# Patient Record
Sex: Female | Born: 1990 | Race: White | Hispanic: No | Marital: Married | Smoking: Never smoker
Health system: Southern US, Community
[De-identification: ages and names within clinical notes are randomized; demographics above are authoritative.]

## PROBLEM LIST (undated history)

## (undated) DIAGNOSIS — D649 Anemia, unspecified: Secondary | ICD-10-CM

## (undated) HISTORY — PX: TUBAL LIGATION: SHX77

---

## 2011-04-08 ENCOUNTER — Encounter (HOSPITAL_COMMUNITY): Payer: Self-pay | Admitting: *Deleted

## 2011-04-08 ENCOUNTER — Emergency Department (HOSPITAL_COMMUNITY)
Admission: EM | Admit: 2011-04-08 | Discharge: 2011-04-08 | Disposition: A | Discharge status: Home / Self Care | Payer: Medicaid Other | Attending: Emergency Medicine | Admitting: Emergency Medicine

## 2011-04-08 DIAGNOSIS — O9989 Other specified diseases and conditions complicating pregnancy, childbirth and the puerperium: Secondary | ICD-10-CM | POA: Insufficient documentation

## 2011-04-08 DIAGNOSIS — G40909 Epilepsy, unspecified, not intractable, without status epilepticus: Secondary | ICD-10-CM | POA: Insufficient documentation

## 2011-04-08 DIAGNOSIS — T2112XA Burn of first degree of abdominal wall, initial encounter: Secondary | ICD-10-CM | POA: Insufficient documentation

## 2011-04-08 DIAGNOSIS — R109 Unspecified abdominal pain: Secondary | ICD-10-CM | POA: Insufficient documentation

## 2011-04-08 DIAGNOSIS — T300 Burn of unspecified body region, unspecified degree: Secondary | ICD-10-CM
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
## 2011-04-09 NOTE — ED Provider Notes (Signed)
Medical screening examination/treatment/procedure(s) were performed by non-physician practitioner and as supervising physician I was immediately available for consultation/collaboration.    Nelia Shi, MD 04/09/11 2015

## 2011-04-15 ENCOUNTER — Emergency Department (HOSPITAL_COMMUNITY): Payer: Medicaid Other

## 2011-04-15 ENCOUNTER — Encounter (HOSPITAL_COMMUNITY): Payer: Self-pay | Admitting: *Deleted

## 2011-04-15 ENCOUNTER — Other Ambulatory Visit: Payer: Self-pay

## 2011-04-15 ENCOUNTER — Emergency Department (HOSPITAL_COMMUNITY)
Admission: EM | Admit: 2011-04-15 | Discharge: 2011-04-15 | Disposition: A | Discharge status: Home / Self Care | Payer: Medicaid Other | Attending: Emergency Medicine | Admitting: Emergency Medicine

## 2011-04-15 DIAGNOSIS — O99019 Anemia complicating pregnancy, unspecified trimester: Secondary | ICD-10-CM | POA: Insufficient documentation

## 2011-04-15 DIAGNOSIS — M542 Cervicalgia: Secondary | ICD-10-CM | POA: Insufficient documentation

## 2011-04-15 DIAGNOSIS — O99891 Other specified diseases and conditions complicating pregnancy: Secondary | ICD-10-CM | POA: Insufficient documentation

## 2011-04-15 DIAGNOSIS — H539 Unspecified visual disturbance: Secondary | ICD-10-CM | POA: Insufficient documentation

## 2011-04-15 DIAGNOSIS — F40298 Other specified phobia: Secondary | ICD-10-CM | POA: Insufficient documentation

## 2011-04-15 DIAGNOSIS — D649 Anemia, unspecified: Secondary | ICD-10-CM | POA: Insufficient documentation

## 2011-04-15 DIAGNOSIS — R209 Unspecified disturbances of skin sensation: Secondary | ICD-10-CM | POA: Insufficient documentation

## 2011-04-15 DIAGNOSIS — M79609 Pain in unspecified limb: Secondary | ICD-10-CM | POA: Insufficient documentation

## 2011-04-15 DIAGNOSIS — R0602 Shortness of breath: Secondary | ICD-10-CM | POA: Insufficient documentation

## 2011-04-15 DIAGNOSIS — R079 Chest pain, unspecified: Secondary | ICD-10-CM | POA: Insufficient documentation

## 2011-04-15 DIAGNOSIS — R5381 Other malaise: Secondary | ICD-10-CM | POA: Insufficient documentation

## 2011-04-15 HISTORY — DX: Anemia, unspecified: D64.9

## 2011-04-15 LAB — DIFFERENTIAL
Eosinophils Absolute: 0.1 10*3/uL (ref 0.0–0.7)
Eosinophils Relative: 1 % (ref 0–5)
Lymphs Abs: 1.2 10*3/uL (ref 0.7–4.0)
Monocytes Absolute: 0.4 10*3/uL (ref 0.1–1.0)
Monocytes Relative: 5 % (ref 3–12)

## 2011-04-15 LAB — BASIC METABOLIC PANEL
BUN: 6 mg/dL (ref 6–23)
Calcium: 9.5 mg/dL (ref 8.4–10.5)
Creatinine, Ser: 0.43 mg/dL — ABNORMAL LOW (ref 0.50–1.10)
GFR calc non Af Amer: >90 mL/min (ref >90)
Glucose, Bld: 76 mg/dL (ref 70–99)

## 2011-04-15 LAB — CBC
Hemoglobin: 11.3 g/dL — ABNORMAL LOW (ref 12.0–15.0)
MCH: 30.2 pg (ref 26.0–34.0)
MCV: 90.4 fL (ref 78.0–100.0)
Platelets: 262 10*3/uL (ref 150–400)
RBC: 3.74 MIL/uL — ABNORMAL LOW (ref 3.87–5.11)

## 2011-04-15 MED ORDER — LORAZEPAM 1 MG PO TABS
1.0000 mg | ORAL_TABLET | Freq: Once | ORAL | Status: AC
  Administered 2011-04-15: 1 mg via ORAL
  Filled 2011-04-15: qty 1

## 2011-04-15 MED ORDER — ACETAMINOPHEN 325 MG PO TABS
650.0000 mg | ORAL_TABLET | Freq: Once | ORAL | Status: AC
  Administered 2011-04-15: 650 mg via ORAL
  Filled 2011-04-15: qty 2
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
## 2011-04-15 NOTE — ED Notes (Signed)
Neurology consult done as requested by Dr. Bebe Shaggy.

## 2011-04-15 NOTE — ED Notes (Signed)
Pt requesting something to drink, pt offered fluids per EDP approval.

## 2011-04-15 NOTE — ED Notes (Signed)
Neuro consult being done via video

## 2011-04-15 NOTE — ED Notes (Signed)
Pt off the floor for MRI

## 2011-04-15 NOTE — ED Notes (Addendum)
Pt returned from MRI °

## 2011-04-15 NOTE — ED Notes (Signed)
Pt was standing in line at [facility] and started experiencing sudden severe neck pain radiating down bil arms.  Pt states vision went blurry, numbness down bil arms and became dizzy.  Reports episode lasted approx 5 min.  Denies syncope.  Denies blurry vision and arm numbness at this time.  C/o pain to neck and down both arms.  Denies neck injury.  Pt approx [redacted] wks pregnant.

## 2014-01-02 ENCOUNTER — Encounter (HOSPITAL_COMMUNITY): Payer: Self-pay | Admitting: *Deleted

## 2016-08-21 ENCOUNTER — Ambulatory Visit: Payer: Self-pay | Admitting: Family

## 2016-08-22 ENCOUNTER — Encounter: Payer: Self-pay | Admitting: Family

## 2017-05-07 ENCOUNTER — Emergency Department (HOSPITAL_COMMUNITY)
Admission: EM | Admit: 2017-05-07 | Discharge: 2017-05-07 | Disposition: A | Discharge status: Home / Self Care | Payer: Medicaid Other | Attending: Emergency Medicine | Admitting: Emergency Medicine

## 2017-05-07 ENCOUNTER — Encounter (HOSPITAL_COMMUNITY): Payer: Self-pay

## 2017-05-07 ENCOUNTER — Emergency Department (HOSPITAL_COMMUNITY): Payer: Medicaid Other

## 2017-05-07 DIAGNOSIS — R109 Unspecified abdominal pain: Secondary | ICD-10-CM | POA: Diagnosis present

## 2017-05-07 DIAGNOSIS — N200 Calculus of kidney: Secondary | ICD-10-CM | POA: Diagnosis not present

## 2017-05-07 DIAGNOSIS — Z79899 Other long term (current) drug therapy: Secondary | ICD-10-CM | POA: Insufficient documentation

## 2017-05-07 LAB — CBC WITH DIFFERENTIAL/PLATELET
BASOS ABS: 0 10*3/uL (ref 0.0–0.1)
BASOS PCT: 0 %
EOS ABS: 0 10*3/uL (ref 0.0–0.7)
EOS PCT: 1 %
HCT: 35.5 % — ABNORMAL LOW (ref 36.0–46.0)
Hemoglobin: 11 g/dL — ABNORMAL LOW (ref 12.0–15.0)
LYMPHS PCT: 13 %
Lymphs Abs: 0.8 10*3/uL (ref 0.7–4.0)
MCH: 29.9 pg (ref 26.0–34.0)
MCHC: 31 g/dL (ref 30.0–36.0)
MCV: 96.5 fL (ref 78.0–100.0)
Monocytes Absolute: 0.4 10*3/uL (ref 0.1–1.0)
Monocytes Relative: 8 %
Neutro Abs: 4.6 10*3/uL (ref 1.7–7.7)
Neutrophils Relative %: 78 %
PLATELETS: 223 10*3/uL (ref 150–400)
RBC: 3.68 MIL/uL — ABNORMAL LOW (ref 3.87–5.11)
RDW: 14.2 % (ref 11.5–15.5)
WBC: 5.9 10*3/uL (ref 4.0–10.5)

## 2017-05-07 LAB — URINALYSIS, ROUTINE W REFLEX MICROSCOPIC
BACTERIA UA: NONE SEEN
BILIRUBIN URINE: NEGATIVE
Glucose, UA: NEGATIVE mg/dL
Ketones, ur: 5 mg/dL — AB
Leukocytes, UA: NEGATIVE
NITRITE: NEGATIVE
PROTEIN: NEGATIVE mg/dL
SPECIFIC GRAVITY, URINE: 1.008 (ref 1.005–1.030)
pH: 6 (ref 5.0–8.0)

## 2017-05-07 LAB — COMPREHENSIVE METABOLIC PANEL
ALBUMIN: 4.1 g/dL (ref 3.5–5.0)
ALT: 12 U/L — AB (ref 14–54)
AST: 19 U/L (ref 15–41)
Alkaline Phosphatase: 50 U/L (ref 38–126)
Anion gap: 11 (ref 5–15)
BUN: 8 mg/dL (ref 6–20)
CHLORIDE: 106 mmol/L (ref 101–111)
CO2: 22 mmol/L (ref 22–32)
CREATININE: 0.48 mg/dL (ref 0.44–1.00)
Calcium: 8.8 mg/dL — ABNORMAL LOW (ref 8.9–10.3)
GFR calc Af Amer: >60 mL/min (ref >60)
Glucose, Bld: 82 mg/dL (ref 65–99)
POTASSIUM: 3.3 mmol/L — AB (ref 3.5–5.1)
SODIUM: 139 mmol/L (ref 135–145)
Total Bilirubin: 0.7 mg/dL (ref 0.3–1.2)
Total Protein: 7.6 g/dL (ref 6.5–8.1)

## 2017-05-07 LAB — LIPASE, BLOOD: LIPASE: 32 U/L (ref 11–51)

## 2017-05-07 LAB — PREGNANCY, URINE: PREG TEST UR: NEGATIVE

## 2017-05-07 MED ORDER — ONDANSETRON 4 MG PO TBDP: 4.0000 mg | ORAL_TABLET | Freq: Three times a day (TID) | ORAL | 0 refills | Status: DC | PRN

## 2017-05-07 MED ORDER — SODIUM CHLORIDE 0.9 % IV BOLUS (SEPSIS)
1000.0000 mL | Freq: Once | INTRAVENOUS | Status: AC
  Administered 2017-05-07: 1000 mL via INTRAVENOUS

## 2017-05-07 MED ORDER — ONDANSETRON HCL 4 MG/2ML IJ SOLN
4.0000 mg | Freq: Once | INTRAMUSCULAR | Status: AC
  Administered 2017-05-07: 4 mg via INTRAVENOUS
  Filled 2017-05-07: qty 2

## 2017-05-07 MED ORDER — OXYCODONE-ACETAMINOPHEN 5-325 MG PO TABS: 1.0000 | ORAL_TABLET | ORAL | 0 refills | Status: DC | PRN

## 2017-05-07 MED ORDER — MORPHINE SULFATE (PF) 4 MG/ML IV SOLN
4.0000 mg | Freq: Once | INTRAVENOUS | Status: AC
  Administered 2017-05-07: 4 mg via INTRAVENOUS
  Filled 2017-05-07: qty 1

## 2017-05-07 MED ORDER — KETOROLAC TROMETHAMINE 30 MG/ML IJ SOLN
30.0000 mg | Freq: Once | INTRAMUSCULAR | Status: AC
  Administered 2017-05-07: 30 mg via INTRAVENOUS
  Filled 2017-05-07: qty 1

## 2017-05-07 MED ORDER — IOPAMIDOL (ISOVUE-300) INJECTION 61%
100.0000 mL | Freq: Once | INTRAVENOUS | Status: AC | PRN
  Administered 2017-05-07: 100 mL via INTRAVENOUS

## 2017-05-07 NOTE — ED Provider Notes (Signed)
[facility] EMERGENCY DEPARTMENT Provider Note   CSN: [id number] Arrival date & time: 05/07/17  3154     History   Chief Complaint Chief Complaint  Patient presents with  . Back Pain    HPI Vanessa Villanueva is a 27 y.o. female.  Right flank pain with radiation to the right lower quadrant since 4 AM today with associated nausea and diarrhea.  No dysuria, hematuria, vaginal bleeding, vaginal discharge.  No history of kidney stones.  Severity of pain is moderate to severe.  Nothing makes symptoms better or worse.      Past Medical History:  Diagnosis Date  . Anemia     There are no active problems to display for this patient.   History reviewed. No pertinent surgical history.  OB History    Gravida Para Term Preterm AB Living   1             SAB TAB Ectopic Multiple Live Births                   Home Medications    Prior to Admission medications   Medication Sig Start Date End Date Taking? Authorizing Provider  ferrous sulfate 325 (65 FE) MG tablet Take 325 mg by mouth 2 (two) times daily.   Yes [provider]  FLUoxetine (PROZAC) 20 MG capsule Take 20 mg by mouth daily.    [provider]  ondansetron (ZOFRAN ODT) 4 MG disintegrating tablet Take 1 tablet (4 mg total) by mouth every 8 (eight) hours as needed for nausea or vomiting. 05/07/17   Nat Christen, MD  oxyCODONE-acetaminophen (PERCOCET) 5-325 MG tablet Take 1-2 tablets by mouth every 4 (four) hours as needed. 05/07/17   Nat Christen, MD    Family History No family history on file.  Social History Social History   Tobacco Use  . Smoking status: Never Smoker  . Smokeless tobacco: Never Used  Substance Use Topics  . Alcohol use: No  . Drug use: No     Allergies   Codeine   Review of Systems Review of Systems  All other systems reviewed and are negative.    Physical Exam Updated Vital Signs BP 101/79 (BP Location: Right Arm)   Pulse 71   Temp 99 F (37.2 C) (Oral)    Resp 18   Ht 5\' 1"  (1.549 m)   Wt 52.2 kg (115 lb)   LMP 05/02/2017 (Approximate)   SpO2 100%   BMI 21.73 kg/m   Physical Exam  Constitutional: She is oriented to person, place, and time. She appears well-developed and well-nourished.  HENT:  Head: Normocephalic and atraumatic.  Eyes: Conjunctivae are normal.  Neck: Neck supple.  Cardiovascular: Normal rate and regular rhythm.  Pulmonary/Chest: Effort normal and breath sounds normal.  Abdominal:  Minimal tenderness right lower quadrant  Genitourinary:  Genitourinary Comments: Right CVAT  Musculoskeletal: Normal range of motion.  Neurological: She is alert and oriented to person, place, and time.  Skin: Skin is warm and dry.  Psychiatric: She has a normal mood and affect. Her behavior is normal.  Nursing note and vitals reviewed.    ED Treatments / Results  Labs (all labs ordered are listed, but only abnormal results are displayed) Labs Reviewed  URINALYSIS, ROUTINE W REFLEX MICROSCOPIC - Abnormal; Notable for the following components:      Result Value   Color, Urine STRAW (*)    APPearance HAZY (*)    Hgb urine dipstick LARGE (*)  Ketones, ur 5 (*)    Squamous Epithelial / LPF 6-30 (*)    All other components within normal limits  CBC WITH DIFFERENTIAL/PLATELET - Abnormal; Notable for the following components:   RBC 3.68 (*)    Hemoglobin 11.0 (*)    HCT 35.5 (*)    All other components within normal limits  COMPREHENSIVE METABOLIC PANEL - Abnormal; Notable for the following components:   Potassium 3.3 (*)    Calcium 8.8 (*)    ALT 12 (*)    All other components within normal limits  PREGNANCY, URINE  LIPASE, BLOOD    EKG  EKG Interpretation None       Radiology Ct Abdomen Pelvis W Contrast  Result Date: 05/07/2017 CLINICAL DATA:  27 year old female with right flank pain radiating to the suprapubic region. Nausea and diarrhea since 0400 hours today. EXAM: CT ABDOMEN AND PELVIS WITH CONTRAST TECHNIQUE:  Multidetector CT imaging of the abdomen and pelvis was performed using the standard protocol following bolus administration of intravenous contrast. CONTRAST:  146mL ISOVUE-300 IOPAMIDOL (ISOVUE-300) INJECTION 61% COMPARISON:  Prior OB ultrasound 07/19/2015. FINDINGS: Lower chest: Mild crowding of lung markings at the lung bases. No pericardial or pleural effusion. Hepatobiliary: Negative liver, gallbladder, and decompressed biliary ducts. Pancreas: Negative. Spleen: Negative. Adrenals/Urinary Tract: Normal adrenal glands. Punctate left nephrolithiasis. No left hydronephrosis or perinephric stranding. Proximal left ureter appears normal. There is a mildly delayed nephrogram on the right. There are small 2-3 millimeter right upper pole renal calculi. The right renal collecting system is mildly enlarged compatible with hydronephrosis. The proximal and mid right ureter also appear dilated. The course of the distal right ureter becomes obscured in the pelvis around the right adnexa. It appears the ureter is decompressed at the uretero vesicle junction. A 4 x 5 millimeter calculus along the right pelvic sidewall could be within the distal right ureter and is in proximity to the distal visible ureter as seen on coronal images 35-37. A larger more anterior calculus on series 2, image 65 appears associated with the uterus and right adnexa. There are additional small pelvic floor phleboliths. Unremarkable urinary bladder. Stomach/Bowel: Decompressed rectum. Questionable rectal mucosal hyperenhancement but no perirectal inflammatory stranding. Mildly redundant sigmoid colon is also decompressed with no definite mucosal enhancement. Likewise, decompressed left colon. Gas-filled but nondilated transverse colon. Oral contrast has reached the ascending colon. No right colon wall thickening or inflammation. Negative terminal ileum. The appendix is not well identified and must be diminutive. There is no pericecal inflammation. No  mesenteric inflammation in the distal small bowel. Small bowel loops are fills with air and oral contrast but no abnormally dilated loops are identified. Negative stomach and duodenum. No abdominal free air or free fluid. Vascular/Lymphatic: Major arterial structures in the abdomen and pelvis are normal. Portal venous system is patent. No lymphadenopathy. Reproductive: Prominent parametrial enhancement. Enhancing probable follicle/corpus luteum in the right ovary on series 2, image 63. Other: Small volume pelvic free fluid, mostly in the cul-de-sac. Musculoskeletal: Mild lower lumbar facet hypertrophy. No other osseous abnormality. IMPRESSION: 1. Acute obstructive uropathy suspected on the right due to a 4 x 5 mm distal right ureteral calculus located along the right pelvic sidewall. There is mild to moderate right hydronephrosis, proximal right hydroureter, and a mildly delayed right nephrogram. 2. Underlying bilateral nephrolithiasis. 3. The appendix is poorly delineated and must be decompressed. No evidence of acute appendicitis. 4. Trace pelvic free fluid and parametrial hyperenhancement are likely physiologic. Electronically Signed   By: Lemmie Evens  [place] Crane M.D.   On: 05/07/2017 10:49    Procedures Procedures (including critical care time)  Medications Ordered in ED Medications  ondansetron (ZOFRAN) injection 4 mg (4 mg Intravenous Given 05/07/17 0755)  sodium chloride 0.9 % bolus 1,000 mL (0 mLs Intravenous Stopped 05/07/17 0830)  morphine 4 MG/ML injection 4 mg (4 mg Intravenous Given 05/07/17 0750)  iopamidol (ISOVUE-300) 61 % injection 100 mL (100 mLs Intravenous Contrast Given 05/07/17 1013)  ketorolac (TORADOL) 30 MG/ML injection 30 mg (30 mg Intravenous Given 05/07/17 1133)  ondansetron (ZOFRAN) injection 4 mg (4 mg Intravenous Given 05/07/17 1134)     Initial Impression / Assessment and Plan / ED Course  I have reviewed the triage vital signs and the nursing notes.  Pertinent labs & imaging results that  were available during my care of the patient were reviewed by me and considered in my medical decision making (see chart for details).     Patient presents with right flank pain radiating to the right lower quadrant/suprapubic area.  CT scan reveals a 4 x 5 mm distal ureteral stone with hydronephrosis.  Pain was treated adequately in the emergency department c morphine and Toradol.  Discharge medication Percocet and Zofran 4 mg ODT.  Referral to urology.  Discussed with patient and her husband.  Final Clinical Impressions(s) / ED Diagnoses   Final diagnoses:  Right kidney stone    ED Discharge Orders        Ordered    oxyCODONE-acetaminophen (PERCOCET) 5-325 MG tablet  Every 4 hours PRN     05/07/17 1149    ondansetron (ZOFRAN ODT) 4 MG disintegrating tablet  Every 8 hours PRN     05/07/17 1149       Nat Christen, MD 05/07/17 1301

## 2017-05-07 NOTE — Discharge Instructions (Signed)
You have a kidney stone on the right side.  Medicine for pain, nausea.  Follow-up with urologist.  Phone number given.

## 2017-05-07 NOTE — ED Triage Notes (Signed)
Pt states she awoke at approx 4 am with right middle back pain, nausea and diarrhea x 2.  Pt denies injury or trauma.

## 2017-05-15 ENCOUNTER — Ambulatory Visit (INDEPENDENT_AMBULATORY_CARE_PROVIDER_SITE_OTHER): Payer: Medicaid Other | Admitting: Urology

## 2017-05-15 ENCOUNTER — Ambulatory Visit (HOSPITAL_COMMUNITY)
Admission: RE | Admit: 2017-05-15 | Discharge: 2017-05-15 | Disposition: A | Discharge status: Home / Self Care | Payer: Medicaid Other | Source: Ambulatory Visit | Attending: Urology | Admitting: Urology

## 2017-05-15 ENCOUNTER — Other Ambulatory Visit: Payer: Self-pay | Admitting: Urology

## 2017-05-15 DIAGNOSIS — R935 Abnormal findings on diagnostic imaging of other abdominal regions, including retroperitoneum: Secondary | ICD-10-CM | POA: Insufficient documentation

## 2017-05-15 DIAGNOSIS — N201 Calculus of ureter: Secondary | ICD-10-CM | POA: Diagnosis not present

## 2017-05-21 ENCOUNTER — Ambulatory Visit: Payer: Self-pay | Admitting: Family

## 2017-05-29 ENCOUNTER — Ambulatory Visit (INDEPENDENT_AMBULATORY_CARE_PROVIDER_SITE_OTHER): Payer: Medicaid Other | Admitting: Urology

## 2017-05-29 DIAGNOSIS — R1084 Generalized abdominal pain: Secondary | ICD-10-CM | POA: Diagnosis not present

## 2017-05-29 DIAGNOSIS — N201 Calculus of ureter: Secondary | ICD-10-CM

## 2017-06-02 ENCOUNTER — Ambulatory Visit: Payer: Medicaid Other | Admitting: Family

## 2017-06-02 ENCOUNTER — Ambulatory Visit (INDEPENDENT_AMBULATORY_CARE_PROVIDER_SITE_OTHER): Payer: Medicaid Other

## 2017-06-02 ENCOUNTER — Encounter: Payer: Self-pay | Admitting: Family

## 2017-06-02 VITALS — BP 110/78 | HR 81 | Temp 97.6°F | Ht 61.5 in | Wt 115.6 lb

## 2017-06-02 DIAGNOSIS — W108XXA Fall (on) (from) other stairs and steps, initial encounter: Secondary | ICD-10-CM | POA: Diagnosis not present

## 2017-06-02 DIAGNOSIS — M79602 Pain in left arm: Secondary | ICD-10-CM

## 2017-06-02 DIAGNOSIS — T148XXA Other injury of unspecified body region, initial encounter: Secondary | ICD-10-CM

## 2017-06-02 DIAGNOSIS — S59912A Unspecified injury of left forearm, initial encounter: Secondary | ICD-10-CM

## 2017-06-02 NOTE — Patient Instructions (Signed)

## 2017-06-02 NOTE — Progress Notes (Signed)
Subjective:    Patient ID: Vanessa Villanueva, female    DOB: Jul 10, 1990, 27 y.o.   MRN: [id number]  .  HPI PT presents to the office today to establish and complaints of a "knot" on left arm. PT states she fell down the steps about 4 weeks ago and and had bruising and swelling.   She reports that the bruising and the swelling improve slightly, but still has a knot.   She reports constant pain of 6 out 10 that is worse when she puts pressure on it   Review of Systems  All other systems reviewed and are negative.   Breast Cancer-relatedfamily history is not on file.  Social History   Socioeconomic History  . Marital status: Married    Spouse name: Not on file  . Number of children: Not on file  . Years of education: Not on file  . Highest education level: Not on file  Occupational History  . Not on file  Social Needs  . Financial resource strain: Not on file  . Food insecurity:    Worry: Not on file    Inability: Not on file  . Transportation needs:    Medical: Not on file    Non-medical: Not on file  Tobacco Use  . Smoking status: Never Smoker  . Smokeless tobacco: Never Used  Substance and Sexual Activity  . Alcohol use: No  . Drug use: No  . Sexual activity: Never  Lifestyle  . Physical activity:    Days per week: Not on file    Minutes per session: Not on file  . Stress: Not on file  Relationships  . Social connections:    Talks on phone: Not on file    Gets together: Not on file    Attends religious service: Not on file    Active member of club or organization: Not on file    Attends meetings of clubs or organizations: Not on file    Relationship status: Not on file  Other Topics Concern  . Not on file  Social History Narrative  . Not on file       Objective:   Physical Exam  Constitutional: She is oriented to person, place, and time. She appears well-developed and well-nourished. No distress.  HENT:  Head: Normocephalic.  Eyes: Pupils are  equal, round, and reactive to light.  Neck: Normal range of motion. Neck supple. No thyromegaly present.  Cardiovascular: Normal rate, regular rhythm, normal heart sounds and intact distal pulses.  No murmur heard. Pulmonary/Chest: Effort normal and breath sounds normal. No respiratory distress. She has no wheezes.  Abdominal: Soft. Bowel sounds are normal. She exhibits no distension. There is no tenderness.  Musculoskeletal: Normal range of motion. She exhibits no edema or tenderness.  Neurological: She is alert and oriented to person, place, and time. Cranial nerve deficit: knot on left forearm that is tender, no bruising   Skin: Skin is warm and dry.  Psychiatric: She has a normal mood and affect. Her behavior is normal. Judgment and thought content normal.  Vitals reviewed.    BP 110/78   Pulse 81   Temp 97.6 F (36.4 C) (Oral)   Ht 5' 1.5" (1.562 m)   Wt 115 lb 9.6 oz (52.4 kg)   LMP 05/02/2017 (Approximate)   BMI 21.49 kg/m      Assessment & Plan:  1. Hematoma Ice NSAID's as needed - DG Forearm Left; Future  2. Fall down steps, initial encounter -  DG Forearm Left; Future  3. Pain of left upper extremity Will do x-ray to rule out any injury, I believe this is a hematoma that will heal in the next few weeks.  RTO if it does not improve or worsen  - DG Forearm Left; Future    Evelina Dun, FNP

## 2017-08-28 ENCOUNTER — Ambulatory Visit: Payer: Medicaid Other | Admitting: Family

## 2017-08-28 ENCOUNTER — Encounter: Payer: Self-pay | Admitting: Family

## 2017-08-28 VITALS — BP 99/73 | HR 102 | Temp 98.8°F | Ht 61.5 in | Wt 117.2 lb

## 2017-08-28 DIAGNOSIS — R51 Headache: Secondary | ICD-10-CM | POA: Diagnosis not present

## 2017-08-28 DIAGNOSIS — R519 Headache, unspecified: Secondary | ICD-10-CM

## 2017-08-28 DIAGNOSIS — G44209 Tension-type headache, unspecified, not intractable: Secondary | ICD-10-CM

## 2017-08-28 MED ORDER — AMITRIPTYLINE HCL 10 MG PO TABS: ORAL_TABLET | ORAL | 1 refills | Status: DC
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
## 2017-08-28 NOTE — Patient Instructions (Addendum)

## 2017-09-01 ENCOUNTER — Telehealth: Payer: Self-pay | Admitting: *Deleted

## 2017-09-01 DIAGNOSIS — R519 Headache, unspecified: Secondary | ICD-10-CM

## 2017-09-01 DIAGNOSIS — R51 Headache: Principal | ICD-10-CM

## 2017-09-01 DIAGNOSIS — G44209 Tension-type headache, unspecified, not intractable: Secondary | ICD-10-CM

## 2017-09-01 MED ORDER — AMITRIPTYLINE HCL 10 MG PO TABS: ORAL_TABLET | ORAL | 1 refills | Status: DC

## 2017-09-01 NOTE — Telephone Encounter (Signed)
Fax received [facility] Script clarification To take for 14 days only or will this be continuous Written 1 tab qhs for 7 days, then 2 tabs for 7 days Please advise

## 2017-09-01 NOTE — Telephone Encounter (Signed)
This will be a continuous dose, but patient to titrate up on the dosage.

## 2017-09-01 NOTE — Addendum Note (Signed)
Addended by: Wardell Heath on: 09/01/2017 01:25 PM   Modules accepted: Orders

## 2017-09-29 ENCOUNTER — Ambulatory Visit: Payer: Medicaid Other | Admitting: Family

## 2018-07-05 ENCOUNTER — Other Ambulatory Visit: Payer: Self-pay

## 2018-07-05 ENCOUNTER — Ambulatory Visit (INDEPENDENT_AMBULATORY_CARE_PROVIDER_SITE_OTHER): Payer: Medicaid Other | Admitting: Nurse Practitioner

## 2018-07-05 ENCOUNTER — Encounter: Payer: Self-pay | Admitting: Nurse Practitioner

## 2018-07-05 DIAGNOSIS — K047 Periapical abscess without sinus: Secondary | ICD-10-CM

## 2018-07-05 MED ORDER — CLINDAMYCIN HCL 300 MG PO CAPS: 300.0000 mg | ORAL_CAPSULE | Freq: Four times a day (QID) | ORAL | 0 refills | Status: DC

## 2018-07-05 NOTE — Progress Notes (Signed)
   Virtual Visit via telephone Note  I connected with Vanessa Villanueva on 07/05/18 at 9:10 by telephone and verified that I am speaking with the correct person using two identifiers. Vanessa Villanueva is currently located at home and no one is currently with her during visit. The provider, Mary-Margaret Hassell Done, FNP is located in their office at time of visit.  I discussed the limitations, risks, security and privacy concerns of performing an evaluation and management service by telephone and the availability of in person appointments. I also discussed with the patient that there may be a patient responsible charge related to this service. The patient expressed understanding and agreed to proceed.   History and Present Illness:  Patient calls in today stating that she had a tooth break off 2 days ago on bottom left 1st molar. Started hurting immediately. Now her jaw is swollen and pain has increased. She has tried Sports coach and they all say that they cannot see her until office opens back up.   Review of Systems  Constitutional: Negative for chills and fever.  HENT: Negative.   Cardiovascular: Negative.   Genitourinary: Negative.   Musculoskeletal: Negative.   Skin: Negative.   Neurological: Negative.   Psychiatric/Behavioral: Negative.   All other systems reviewed and are negative.    Observations/Objective: Alert and oriented- answers all questions appropriately sounds in mild distress due to pain   Assessment and Plan: Vanessa Villanueva in today with chief complaint of Dental Pain   1. Tooth abscess Gargle with salt water motrin OTC for pain anbesol- can help relieve some pain Ice pack to face for swelling Meds ordered this encounter  Medications  . clindamycin (CLEOCIN) 300 MG capsule    Sig: Take 1 capsule (300 mg total) by mouth 4 (four) times daily.    Dispense:  40 capsule    Refill:  0    Order Specific Question:   Supervising Provider    Answer:    Caryl Pina A [[id number]]      Follow Up Instructions: Prn   I discussed the assessment and treatment plan with the patient. The patient was provided an opportunity to ask questions and all were answered. The patient agreed with the plan and demonstrated an understanding of the instructions.   The patient was advised to call back or seek an in-person evaluation if the symptoms worsen or if the condition fails to improve as anticipated.  The above assessment and management plan was discussed with the patient. The patient verbalized understanding of and has agreed to the management plan. Patient is aware to call the clinic if symptoms persist or worsen. Patient is aware when to return to the clinic for a follow-up visit. Patient educated on when it is appropriate to go to the emergency department.   Time call ended:  9:20  I provided 10 minutes of non-face-to-face time during this encounter.    Mary-Margaret Hassell Done, FNP

## 2018-09-24 ENCOUNTER — Other Ambulatory Visit: Payer: Medicaid Other

## 2018-09-24 ENCOUNTER — Other Ambulatory Visit: Payer: Self-pay

## 2018-09-24 ENCOUNTER — Encounter: Payer: Self-pay | Admitting: Family Medicine

## 2018-09-24 ENCOUNTER — Encounter (INDEPENDENT_AMBULATORY_CARE_PROVIDER_SITE_OTHER): Payer: Self-pay

## 2018-09-24 ENCOUNTER — Ambulatory Visit (INDEPENDENT_AMBULATORY_CARE_PROVIDER_SITE_OTHER): Payer: Medicaid Other | Admitting: Family Medicine

## 2018-09-24 DIAGNOSIS — J029 Acute pharyngitis, unspecified: Secondary | ICD-10-CM | POA: Diagnosis not present

## 2018-09-24 DIAGNOSIS — Z20822 Contact with and (suspected) exposure to covid-19: Secondary | ICD-10-CM

## 2018-09-24 DIAGNOSIS — R6889 Other general symptoms and signs: Secondary | ICD-10-CM | POA: Diagnosis not present

## 2018-09-24 MED ORDER — AMOXICILLIN 500 MG PO CAPS: 500.0000 mg | ORAL_CAPSULE | Freq: Two times a day (BID) | ORAL | 0 refills | Status: DC

## 2018-09-24 NOTE — Progress Notes (Signed)
Virtual Visit via telephone Note  I connected with Vanessa Villanueva on 09/24/18 at 1003 by telephone and verified that I am speaking with the correct person using two identifiers. Vanessa Villanueva is currently located at home and no other people are currently with her during visit. The provider, Fransisca Kaufmann Walter Grima, MD is located in their office at time of visit.  Call ended at 1017  I discussed the limitations, risks, security and privacy concerns of performing an evaluation and management service by telephone and the availability of in person appointments. I also discussed with the patient that there may be a patient responsible charge related to this service. The patient expressed understanding and agreed to proceed.   History and Present Illness: Patient is calling in with 3 days of feverish and nausea and vomiting 2 days ago.  Yesterday she woke up with a swollen lymph nodes and sore throat.  She has a really red throat and white patches. She is having pain with swallowing.  She has cough and hoarseness and dry cough. She is nasal congestion.  She denies any shortness of breath or wheezing.  She denies sick contacts that she knows of.   No diagnosis found.  Outpatient Encounter Medications as of 09/24/2018  Medication Sig  . amitriptyline (ELAVIL) 10 MG tablet Take 1 tablet (10 mg total) by mouth at bedtime for 7 days, THEN 2 tablets (20 mg total) at bedtime.  . clindamycin (CLEOCIN) 300 MG capsule Take 1 capsule (300 mg total) by mouth 4 (four) times daily.  . ferrous sulfate 325 (65 FE) MG tablet Take 325 mg by mouth 2 (two) times daily.   No facility-administered encounter medications on file as of 09/24/2018.     Review of Systems  Constitutional: Positive for fever. Negative for chills.  HENT: Positive for congestion, postnasal drip and sore throat. Negative for ear discharge, ear pain, rhinorrhea, sinus pressure and sneezing.   Eyes: Negative for pain, redness and visual  disturbance.  Respiratory: Positive for cough. Negative for chest tightness, shortness of breath and wheezing.   Cardiovascular: Negative for chest pain and leg swelling.  Genitourinary: Negative for difficulty urinating and dysuria.  Musculoskeletal: Negative for back pain and gait problem.  Skin: Negative for rash.  Neurological: Negative for light-headedness and headaches.  Psychiatric/Behavioral: Negative for agitation and behavioral problems.  All other systems reviewed and are negative.   Observations/Objective: Patient sounds comfortable and in no acute distress  Assessment and Plan: Problem List Items Addressed This Visit    None    Visit Diagnoses    Pharyngitis, unspecified etiology    -  Primary   Relevant Medications   amoxicillin (AMOXIL) 500 MG capsule   Other Relevant Orders   MYCHART COVID-19 HOME MONITORING PROGRAM   Novel Coronavirus, NAA ([facility])   Temperature monitoring       Follow Up Instructions:  Patient is recommend quarantine Recommended covid testing   I discussed the assessment and treatment plan with the patient. The patient was provided an opportunity to ask questions and all were answered. The patient agreed with the plan and demonstrated an understanding of the instructions.   The patient was advised to call back or seek an in-person evaluation if the symptoms worsen or if the condition fails to improve as anticipated.  The above assessment and management plan was discussed with the patient. The patient verbalized understanding of and has agreed to the management plan. Patient is aware to call the clinic if  symptoms persist or worsen. Patient is aware when to return to the clinic for a follow-up visit. Patient educated on when it is appropriate to go to the emergency department.    I provided 14 minutes of non-face-to-face time during this encounter.    Worthy Rancher, MD

## 2018-09-25 ENCOUNTER — Encounter (INDEPENDENT_AMBULATORY_CARE_PROVIDER_SITE_OTHER): Payer: Self-pay

## 2018-09-26 ENCOUNTER — Encounter (INDEPENDENT_AMBULATORY_CARE_PROVIDER_SITE_OTHER): Payer: Self-pay

## 2018-09-26 LAB — NOVEL CORONAVIRUS, NAA: SARS-CoV-2, NAA: NOT DETECTED

## 2018-09-27 ENCOUNTER — Encounter (INDEPENDENT_AMBULATORY_CARE_PROVIDER_SITE_OTHER): Payer: Self-pay

## 2018-09-30 ENCOUNTER — Encounter: Payer: Self-pay | Admitting: Family Medicine

## 2018-10-01 MED ORDER — CEFDINIR 300 MG PO CAPS: 300.0000 mg | ORAL_CAPSULE | Freq: Two times a day (BID) | ORAL | 0 refills | Status: DC

## 2018-10-02 ENCOUNTER — Telehealth: Payer: Self-pay

## 2018-10-02 ENCOUNTER — Encounter (INDEPENDENT_AMBULATORY_CARE_PROVIDER_SITE_OTHER): Payer: Self-pay

## 2018-10-02 NOTE — Telephone Encounter (Signed)
Pt was on amoxicillin due to strep throat. Talked to PCP yesterday and was prescribed. Sore throat is scratchy, mils. Using nasal spray and Vicks at night due to feeling stopped up, Robitussin daytime severe cold. SOB with activity due to heat. Denies SOB at rest. 99.8 temp. Cough is dry. Pt is using saline nasal spray. Advised pt to suck on cough drops or hard candy, salt water gargles 4-5 times per day, 2 teaspoons of honey at bedtime. Pt advised that if cough is becoming worse with the use of OTC cough medicine or if cough becomes productive with yellow or green phlegm to call PCP. Pt advised that is SOB worsens, or develops tightness or difficulty breathing, to call 911 immediately.

## 2018-11-16 ENCOUNTER — Encounter: Payer: Self-pay | Admitting: Family Medicine

## 2019-01-08 IMAGING — DX DG ABDOMEN 1V
2 series · 2 of 2 positions shown · non-contrast
Comparison: 05/07/2017.

CLINICAL DATA: Right-sided stone.  Pain.

EXAM:
ABDOMEN - 1 VIEW

[abdomen kub (1 of 2)]
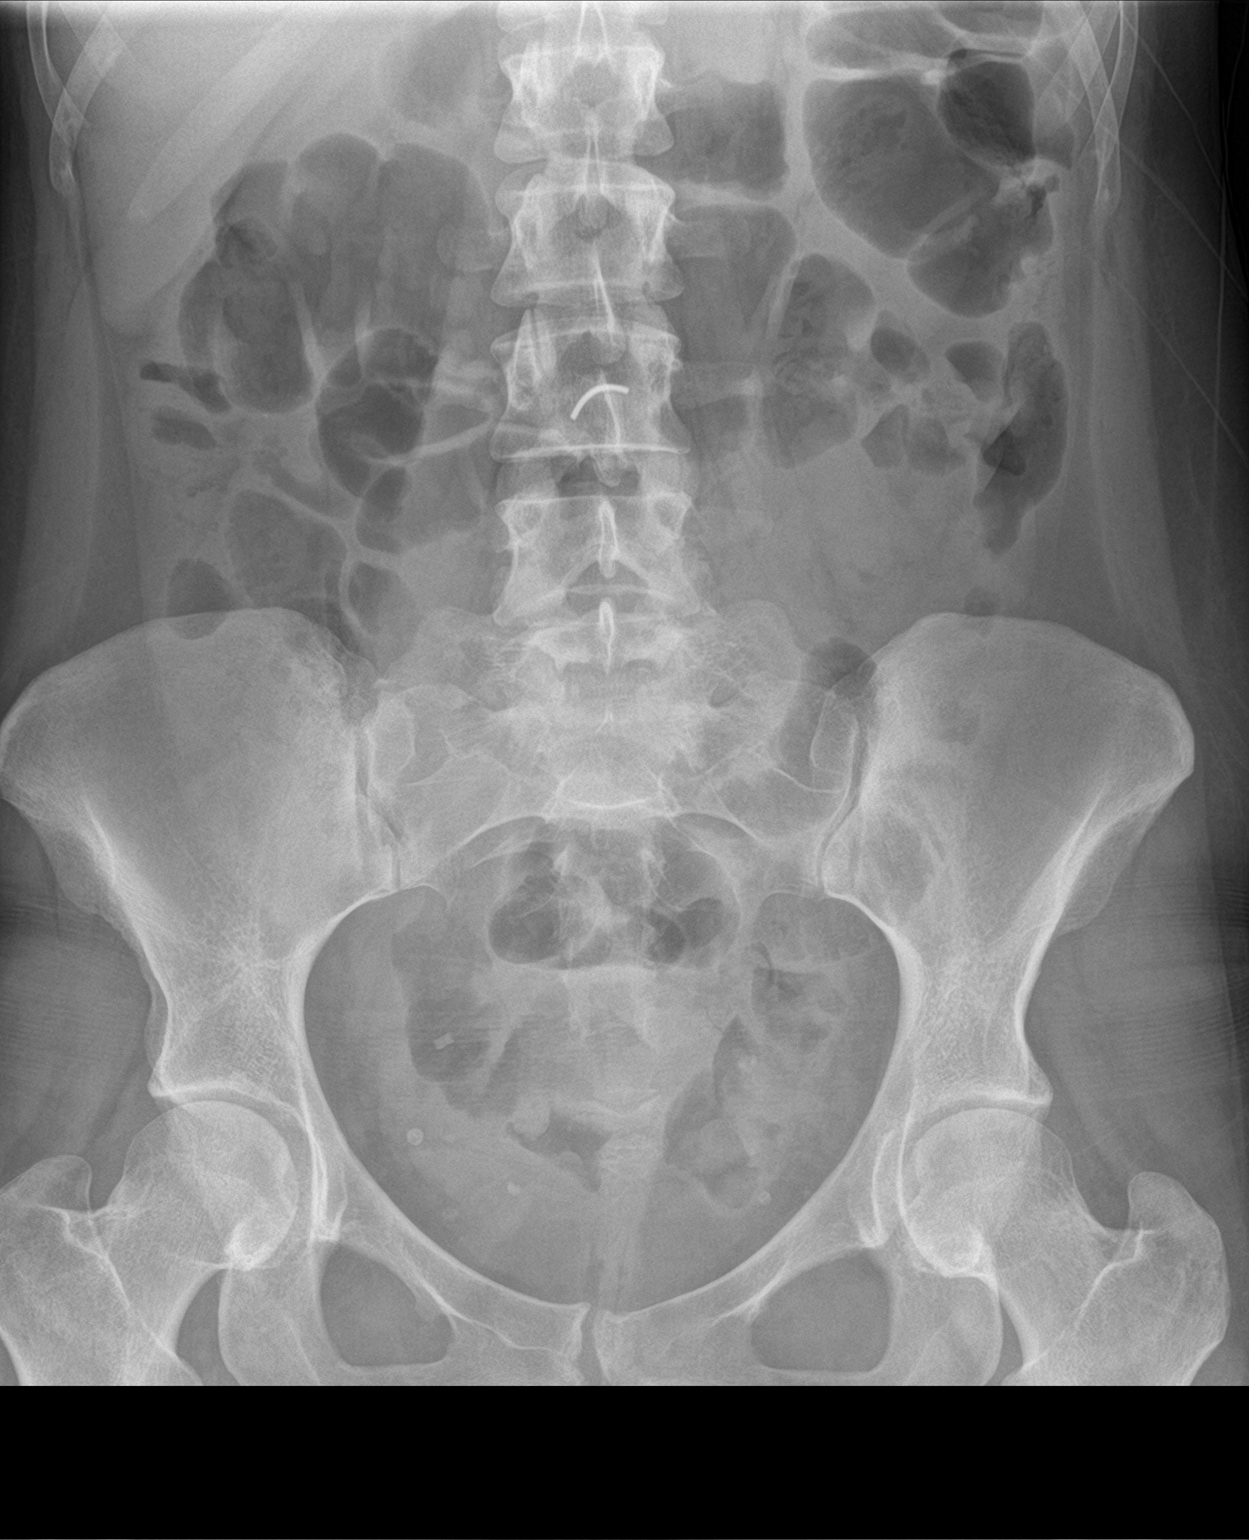

[abdomen kub (2 of 2)]
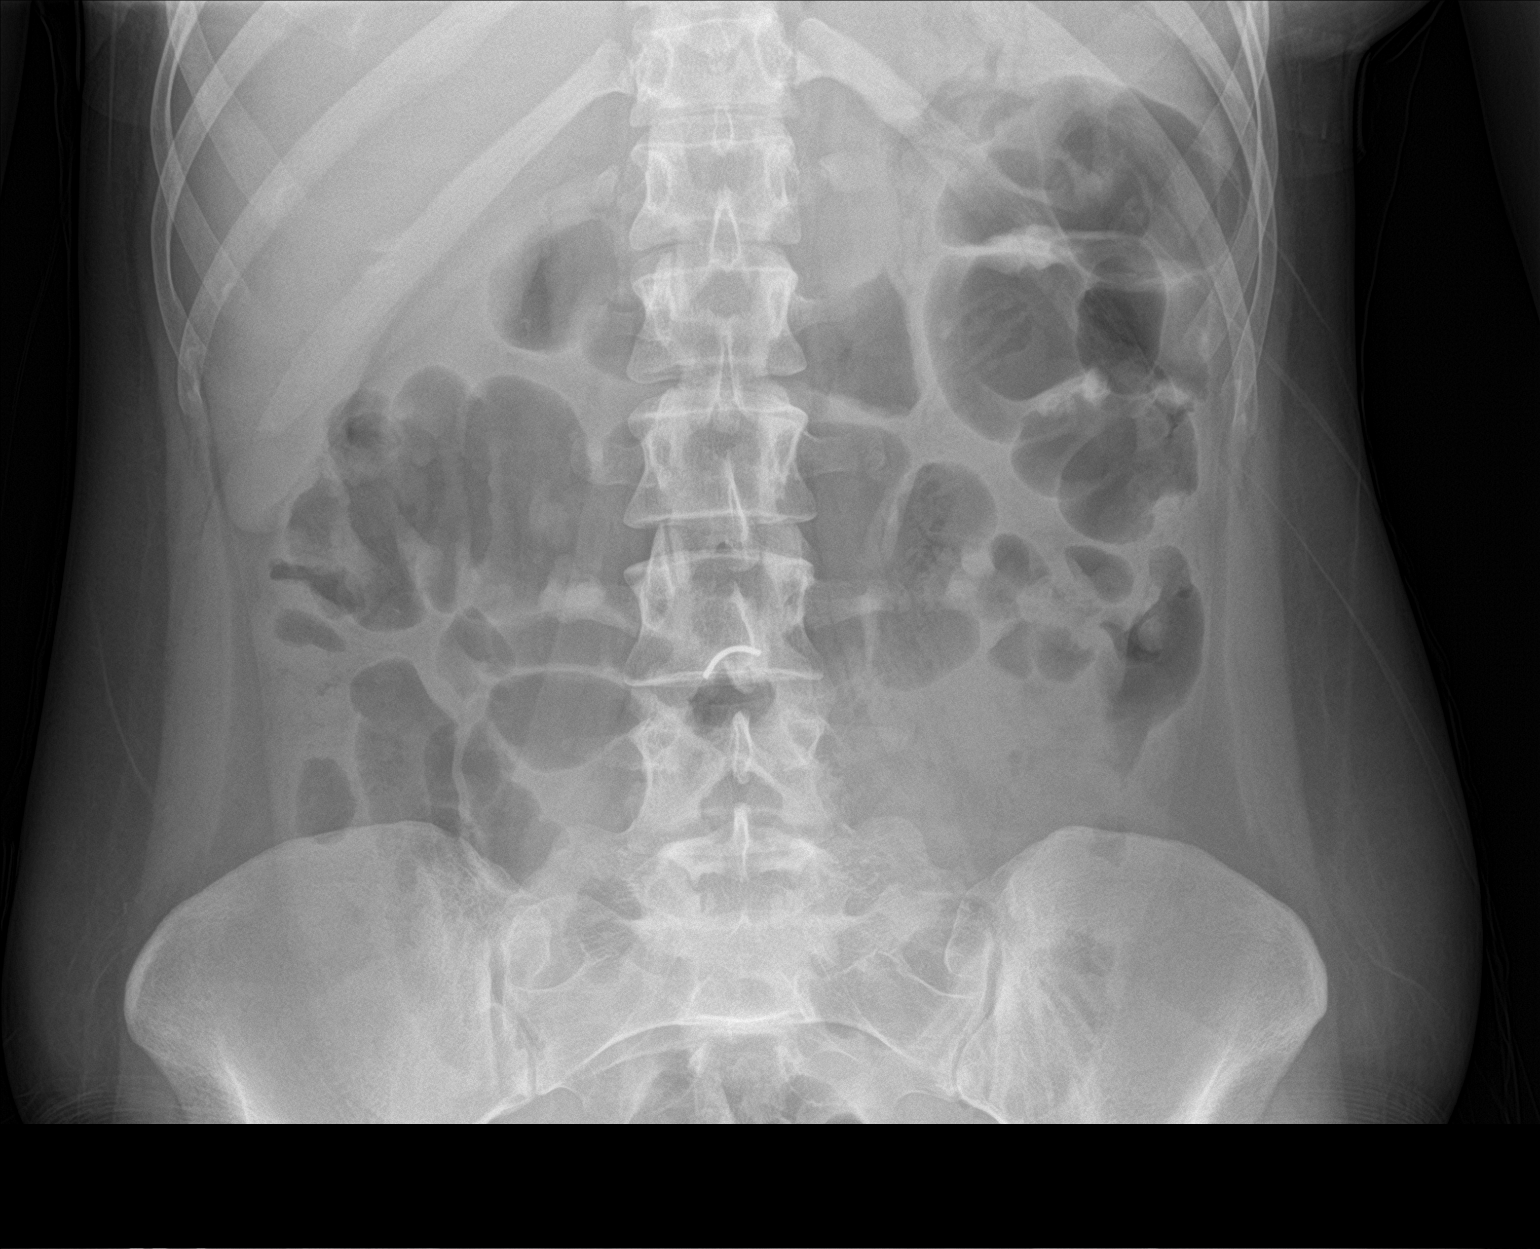

[2 of 2 positions shown; findings below may reference images not displayed]

FINDINGS: Soft tissue structures are unremarkable. Pelvic calcifications most
consistent with phleboliths. Distal ureteral stones cannot be
excluded. Prior tubal ligations. No bowel distention or free air. No
acute bony abnormality
IMPRESSION: No acute abnormality. Calcifications are noted in the pelvis. These
are most consistent phleboliths. Distal ureteral stones cannot be
excluded.

## 2019-03-28 ENCOUNTER — Other Ambulatory Visit: Payer: Self-pay | Admitting: Family

## 2019-03-30 NOTE — Telephone Encounter (Signed)
Patient called.

## 2019-06-15 ENCOUNTER — Encounter: Payer: Self-pay | Admitting: Family Medicine

## 2019-06-15 ENCOUNTER — Ambulatory Visit (INDEPENDENT_AMBULATORY_CARE_PROVIDER_SITE_OTHER): Payer: Medicaid Other | Admitting: Family Medicine

## 2019-06-15 DIAGNOSIS — H6691 Otitis media, unspecified, right ear: Secondary | ICD-10-CM

## 2019-06-15 DIAGNOSIS — J029 Acute pharyngitis, unspecified: Secondary | ICD-10-CM | POA: Diagnosis not present

## 2019-06-15 DIAGNOSIS — R110 Nausea: Secondary | ICD-10-CM

## 2019-06-15 MED ORDER — AMOXICILLIN-POT CLAVULANATE 875-125 MG PO TABS: 1.0000 | ORAL_TABLET | Freq: Two times a day (BID) | ORAL | 0 refills | Status: AC

## 2019-06-15 NOTE — Progress Notes (Signed)
Virtual Visit via telephone Note Due to COVID-19 pandemic this visit was conducted virtually. This visit type was conducted due to national recommendations for restrictions regarding the COVID-19 Pandemic (e.g. social distancing, sheltering in place) in an effort to limit this patient's exposure and mitigate transmission in our community. All issues noted in this document were discussed and addressed.  A physical exam was not performed with this format.   I connected with Vanessa Villanueva on 06/15/2019 at 1410 by telephone and verified that I am speaking with the correct person using two identifiers. Vanessa Villanueva is currently located at home and family is currently with them during visit. The provider, Monia Pouch, FNP is located in their office at time of visit.  I discussed the limitations, risks, security and privacy concerns of performing an evaluation and management service by telephone and the availability of in person appointments. I also discussed with the patient that there may be a patient responsible charge related to this service. The patient expressed understanding and agreed to proceed.  Subjective:  Patient ID: Vanessa Villanueva, female    DOB: [birth year], 29 y.o.   MRN: [id number]  Chief Complaint:  Sore Throat and Otitis Media   HPI: Vanessa Villanueva is a 29 y.o. female presenting on 06/15/2019 for Sore Throat and Otitis Media   Sore Throat  This is a new problem. The current episode started 1 to 4 weeks ago. The problem has been gradually worsening. The pain is at a severity of 4/10. The pain is mild. Associated symptoms include ear pain, swollen glands and trouble swallowing. Pertinent negatives include no abdominal pain, congestion, coughing, diarrhea, drooling, ear discharge, headaches, hoarse voice, plugged ear sensation, neck pain, shortness of breath, stridor or vomiting. Treatments tried: pt reports she had left over Cefdinir from her last strep infection.  Pt states she started feeling better so she stopped taking them. States she had about 5 pills left so she took them again, reports her throat is slightly better but her ear is hurting. The treatment provided moderate relief.  She does report tonsil exudate and cervical adenopathy.    Relevant past medical, surgical, family, and social history reviewed and updated as indicated.  Allergies and medications reviewed and updated.   Past Medical History:  Diagnosis Date  . Anemia     Past Surgical History:  Procedure Laterality Date  . TUBAL LIGATION      Social History   Socioeconomic History  . Marital status: Married    Spouse name: Not on file  . Number of children: Not on file  . Years of education: Not on file  . Highest education level: Not on file  Occupational History  . Not on file  Tobacco Use  . Smoking status: Never Smoker  . Smokeless tobacco: Never Used  Substance and Sexual Activity  . Alcohol use: No  . Drug use: No  . Sexual activity: Never  Other Topics Concern  . Not on file  Social History Narrative  . Not on file   Social Determinants of Health   Financial Resource Strain:   . Difficulty of Paying Living Expenses:   Food Insecurity:   . Worried About Charity fundraiser in the Last Year:   . Arboriculturist in the Last Year:   Transportation Needs:   . Film/video editor (Medical):   Marland Kitchen Lack of Transportation (Non-Medical):   Physical Activity:   . Days of Exercise per Week:   .  Minutes of Exercise per Session:   Stress:   . Feeling of Stress :   Social Connections:   . Frequency of Communication with Friends and Family:   . Frequency of Social Gatherings with Friends and Family:   . Attends Religious Services:   . Active Member of Clubs or Organizations:   . Attends Archivist Meetings:   Marland Kitchen Marital Status:   Intimate Partner Violence:   . Fear of Current or Ex-Partner:   . Emotionally Abused:   Marland Kitchen Physically Abused:   .  Sexually Abused:     Outpatient Encounter Medications as of 06/15/2019  Medication Sig  . amoxicillin-clavulanate (AUGMENTIN) 875-125 MG tablet Take 1 tablet by mouth 2 (two) times daily for 10 days.  . ferrous sulfate 325 (65 FE) MG tablet Take 325 mg by mouth 2 (two) times daily.  . [DISCONTINUED] amoxicillin (AMOXIL) 500 MG capsule Take 1 capsule (500 mg total) by mouth 2 (two) times daily.  . [DISCONTINUED] cefdinir (OMNICEF) 300 MG capsule Take 1 capsule (300 mg total) by mouth 2 (two) times daily. 1 po BID   No facility-administered encounter medications on file as of 06/15/2019.    Allergies  Allergen Reactions  . Codeine Nausea And Vomiting    Review of Systems  Constitutional: Negative for activity change, appetite change, chills, diaphoresis, fatigue, fever and unexpected weight change.  HENT: Positive for ear pain, sore throat and trouble swallowing. Negative for congestion, dental problem, drooling, ear discharge, facial swelling, hearing loss, hoarse voice, mouth sores, nosebleeds, postnasal drip, rhinorrhea, sinus pressure, sinus pain, sneezing, tinnitus and voice change.   Eyes: Negative.  Negative for photophobia and visual disturbance.  Respiratory: Negative for cough, chest tightness, shortness of breath and stridor.   Cardiovascular: Negative for chest pain, palpitations and leg swelling.  Gastrointestinal: Negative for abdominal pain, blood in stool, constipation, diarrhea, nausea and vomiting.  Endocrine: Negative.   Genitourinary: Negative for decreased urine volume, difficulty urinating, dysuria, frequency and urgency.  Musculoskeletal: Negative for arthralgias, myalgias and neck pain.  Skin: Negative.   Allergic/Immunologic: Negative.   Neurological: Negative for dizziness, tremors, seizures, syncope, facial asymmetry, speech difficulty, weakness, light-headedness, numbness and headaches.  Hematological: Negative.   Psychiatric/Behavioral: Negative for confusion,  hallucinations, sleep disturbance and suicidal ideas.  All other systems reviewed and are negative.        Observations/Objective: No vital signs or physical exam, this was a telephone or virtual health encounter.  Pt alert and oriented, answers all questions appropriately, and able to speak in full sentences.    Assessment and Plan: Valynn was seen today for sore throat and otitis media.  Diagnoses and all orders for this visit:  Pharyngitis, unspecified etiology Otitis of right ear CENTOR criteria 3 with known history of strep. Pt educated on proper use of antibiotics and to take full course or therapy. Medications as prescribed and symptomatic care discussed in detail. Report any new, worsening, or persistent symptoms.  -     amoxicillin-clavulanate (AUGMENTIN) 875-125 MG tablet; Take 1 tablet by mouth 2 (two) times daily for 10 days.    Follow Up Instructions: Return if symptoms worsen or fail to improve.    I discussed the assessment and treatment plan with the patient. The patient was provided an opportunity to ask questions and all were answered. The patient agreed with the plan and demonstrated an understanding of the instructions.   The patient was advised to call back or seek an in-person evaluation if the symptoms  worsen or if the condition fails to improve as anticipated.  The above assessment and management plan was discussed with the patient. The patient verbalized understanding of and has agreed to the management plan. Patient is aware to call the clinic if they develop any new symptoms or if symptoms persist or worsen. Patient is aware when to return to the clinic for a follow-up visit. Patient educated on when it is appropriate to go to the emergency department.    I provided 15 minutes of non-face-to-face time during this encounter. The call started at 1410. The call ended at 1425. The other time was used for coordination of care.    Monia Pouch,  FNP-C [facility] [address] [place], [place] [address] [phone] 06/15/2019

## 2019-06-16 ENCOUNTER — Encounter: Payer: Self-pay | Admitting: Family Medicine

## 2019-06-16 MED ORDER — ONDANSETRON HCL 4 MG PO TABS: 4.0000 mg | ORAL_TABLET | Freq: Three times a day (TID) | ORAL | 0 refills | Status: DC | PRN

## 2019-06-16 NOTE — Addendum Note (Signed)
Addended by: Baruch Gouty on: 06/16/2019 04:46 PM   Modules accepted: Orders

## 2019-06-16 NOTE — Addendum Note (Signed)
Addended by: Baruch Gouty on: 06/16/2019 09:37 AM   Modules accepted: Orders

## 2019-07-06 ENCOUNTER — Encounter: Payer: Self-pay | Admitting: Family Medicine

## 2019-07-06 ENCOUNTER — Ambulatory Visit (INDEPENDENT_AMBULATORY_CARE_PROVIDER_SITE_OTHER): Payer: Medicaid Other | Admitting: Family Medicine

## 2019-07-06 DIAGNOSIS — J029 Acute pharyngitis, unspecified: Secondary | ICD-10-CM

## 2019-07-06 MED ORDER — CEFDINIR 300 MG PO CAPS: 300.0000 mg | ORAL_CAPSULE | Freq: Two times a day (BID) | ORAL | 0 refills | Status: AC

## 2019-07-06 NOTE — Progress Notes (Signed)
   Virtual Visit via Telephone Note  I connected with Vanessa Villanueva on 07/06/19 at 1:34 PM by telephone and verified that I am speaking with the correct person using two identifiers. Vanessa Villanueva is currently located at home and nobody is currently with her during this visit. The provider, Loman Brooklyn, FNP is located in their office at time of visit.  I discussed the limitations, risks, security and privacy concerns of performing an evaluation and management service by telephone and the availability of in person appointments. I also discussed with the patient that there may be a patient responsible charge related to this service. The patient expressed understanding and agreed to proceed.  Subjective: PCP: Sharion Balloon, FNP  Chief Complaint  Patient presents with  . Sore Throat   Sore Throat: Patient complains of sore throat that is red with white patches. Onset of symptoms was 1 day ago, unchanged since that time. She is drinking plenty of fluids. She was treated in April for strep as well with Augmentin. She reports the symptoms completely resolved until this morning.    ROS: Per HPI  Current Outpatient Medications:  .  ferrous sulfate 325 (65 FE) MG tablet, Take 325 mg by mouth 2 (two) times daily., Disp: , Rfl:  .  ondansetron (ZOFRAN) 4 MG tablet, Take 1 tablet (4 mg total) by mouth every 8 (eight) hours as needed for nausea or vomiting., Disp: 20 tablet, Rfl: 0  Allergies  Allergen Reactions  . Codeine Nausea And Vomiting   Past Medical History:  Diagnosis Date  . Anemia     Observations/Objective: A&O  No respiratory distress or wheezing audible over the phone Mood, judgement, and thought processes all WNL   Assessment and Plan: 1. Sore throat - Treating empirically for strep given symptoms. Tylenol/Ibpurofen for pain/fever.  - cefdinir (OMNICEF) 300 MG capsule; Take 1 capsule (300 mg total) by mouth 2 (two) times daily for 10 days.  Dispense: 20  capsule; Refill: 0   Follow Up Instructions:  I discussed the assessment and treatment plan with the patient. The patient was provided an opportunity to ask questions and all were answered. The patient agreed with the plan and demonstrated an understanding of the instructions.   The patient was advised to call back or seek an in-person evaluation if the symptoms worsen or if the condition fails to improve as anticipated.  The above assessment and management plan was discussed with the patient. The patient verbalized understanding of and has agreed to the management plan. Patient is aware to call the clinic if symptoms persist or worsen. Patient is aware when to return to the clinic for a follow-up visit. Patient educated on when it is appropriate to go to the emergency department.   Time call ended: 1:39 PM   I provided 7 minutes of non-face-to-face time during this encounter.  Hendricks Limes, MSN, APRN, FNP-C [facility] 07/06/19

## 2019-08-11 ENCOUNTER — Encounter: Payer: Self-pay | Admitting: Family Medicine

## 2019-08-19 ENCOUNTER — Other Ambulatory Visit: Payer: Self-pay

## 2019-08-19 ENCOUNTER — Other Ambulatory Visit: Payer: Self-pay | Admitting: Nurse Practitioner

## 2019-08-19 ENCOUNTER — Telehealth: Payer: Self-pay | Admitting: Family

## 2019-08-19 ENCOUNTER — Other Ambulatory Visit: Payer: Self-pay | Admitting: *Deleted

## 2019-08-19 ENCOUNTER — Ambulatory Visit (HOSPITAL_COMMUNITY)
Admission: RE | Admit: 2019-08-19 | Discharge: 2019-08-19 | Disposition: A | Discharge status: Home / Self Care | Payer: Medicaid Other | Source: Ambulatory Visit | Attending: Nurse Practitioner | Admitting: Nurse Practitioner

## 2019-08-19 ENCOUNTER — Other Ambulatory Visit (HOSPITAL_COMMUNITY)
Admission: RE | Admit: 2019-08-19 | Discharge: 2019-08-19 | Disposition: A | Discharge status: Home / Self Care | Payer: Medicaid Other | Source: Ambulatory Visit | Attending: Nurse Practitioner | Admitting: Nurse Practitioner

## 2019-08-19 ENCOUNTER — Encounter: Payer: Self-pay | Admitting: Nurse Practitioner

## 2019-08-19 ENCOUNTER — Other Ambulatory Visit: Payer: Self-pay | Admitting: Family

## 2019-08-19 ENCOUNTER — Ambulatory Visit: Payer: Medicaid Other | Admitting: Nurse Practitioner

## 2019-08-19 VITALS — BP 108/76 | HR 91 | Temp 97.7°F | Resp 20 | Ht 61.5 in | Wt 128.0 lb

## 2019-08-19 DIAGNOSIS — R102 Pelvic and perineal pain: Secondary | ICD-10-CM | POA: Insufficient documentation

## 2019-08-19 DIAGNOSIS — N83291 Other ovarian cyst, right side: Secondary | ICD-10-CM | POA: Diagnosis not present

## 2019-08-19 LAB — MICROSCOPIC EXAMINATION
Epithelial Cells (non renal): >10 /hpf — AB (ref 0–10)
Renal Epithel, UA: NONE SEEN /hpf

## 2019-08-19 LAB — URINALYSIS, COMPLETE
Bilirubin, UA: NEGATIVE
Glucose, UA: NEGATIVE
Ketones, UA: NEGATIVE
Nitrite, UA: NEGATIVE
Protein,UA: NEGATIVE
Specific Gravity, UA: 1.02 (ref 1.005–1.030)
Urobilinogen, Ur: 0.2 mg/dL (ref 0.2–1.0)
pH, UA: 6 (ref 5.0–7.5)

## 2019-08-19 LAB — BETA HCG QUANT (REF LAB): hCG Quant: <1 m[IU]/mL

## 2019-08-19 MED ORDER — METRONIDAZOLE 500 MG PO TABS: 500.0000 mg | ORAL_TABLET | Freq: Three times a day (TID) | ORAL | 0 refills | Status: DC

## 2019-08-19 NOTE — Telephone Encounter (Signed)
Reviewed. OI  all orders placed.

## 2019-08-19 NOTE — Telephone Encounter (Signed)
Pt would like a call with lab results and she states she is to go for a [place] today.

## 2019-08-19 NOTE — Progress Notes (Addendum)
Acute Office Visit  Subjective:    Patient ID: Vanessa Villanueva, female    DOB: 06-23-1990, 29 y.o.   MRN: [id number]  Chief Complaint  Patient presents with  . Pelvic Pain    Pelvic Pain The patient's primary symptoms include pelvic pain and vaginal bleeding. This is a new problem. The current episode started in the past 7 days. The problem has been unchanged. The problem affects both sides. Pregnant now: Unsure. Associated symptoms include abdominal pain and urgency. Pertinent negatives include no chills, diarrhea, discolored urine, fever, nausea, painful intercourse or rash. Vaginal discharge characteristics: no discharge. The vaginal bleeding is lighter than menses. She has not been passing clots. She has not been passing tissue. Nothing aggravates the symptoms. She has tried nothing for the symptoms. She is sexually active. No, her partner does not have an STD. She uses tubal ligation for contraception. Her menstrual history has been regular.     Past Medical History:  Diagnosis Date  . Anemia       Family History  Problem Relation Age of Onset  . Diabetes Mother   . COPD Mother   . Mental illness Father        Bipolar, schizophenia      Outpatient Medications Prior to Visit  Medication Sig Dispense Refill  . ferrous sulfate 325 (65 FE) MG tablet Take 325 mg by mouth 2 (two) times daily.    . ondansetron (ZOFRAN) 4 MG tablet Take 1 tablet (4 mg total) by mouth every 8 (eight) hours as needed for nausea or vomiting. 20 tablet 0   No facility-administered medications prior to visit.    Allergies  Allergen Reactions  . Codeine Nausea And Vomiting    Review of Systems  Constitutional: Negative for chills and fever.  HENT: Negative.   Eyes: Negative.   Respiratory: Negative.   Gastrointestinal: Positive for abdominal pain. Negative for diarrhea and nausea.  Endocrine: Negative.   Genitourinary: Positive for pelvic pain and urgency.  Musculoskeletal:  Negative.   Skin: Negative for color change and rash.  Neurological: Negative.   Psychiatric/Behavioral: Negative.        Objective:    Physical Exam Constitutional:      Appearance: Normal appearance.  Cardiovascular:     Rate and Rhythm: Regular rhythm.     Pulses: Normal pulses.     Heart sounds: Normal heart sounds.  Pulmonary:     Effort: Pulmonary effort is normal.     Breath sounds: Normal breath sounds.  Abdominal:     General: Bowel sounds are normal.     Tenderness: There is abdominal tenderness. There is no right CVA tenderness or left CVA tenderness.  Musculoskeletal:        General: No tenderness.     Cervical back: Neck supple. No tenderness.     Comments: Pelvic pain  Skin:    General: Skin is warm.     Findings: No erythema or rash.  Neurological:     Mental Status: She is alert and oriented to person, place, and time.  Psychiatric:        Mood and Affect: Mood normal.     BP 108/76   Pulse 91   Temp 97.7 F (36.5 C)   Resp 20   Ht 5' 1.5" (1.562 m)   Wt 128 lb (58.1 kg)   LMP 08/01/2019 (Exact Date)   SpO2 98%   Breastfeeding No   BMI 23.79 kg/m  Wt Readings from Last 3  Encounters:  08/19/19 128 lb (58.1 kg)  08/28/17 117 lb 3.2 oz (53.2 kg)  06/02/17 115 lb 9.6 oz (52.4 kg)    Health Maintenance Due  Topic Date Due  . Hepatitis C Screening  Never done  . COVID-19 Vaccine (1) Never done  . HIV Screening  Never done  . PAP SMEAR-Modifier  Never done      No results found for: TSH Lab Results  Component Value Date   WBC 5.9 05/07/2017   HGB 11.0 (L) 05/07/2017   HCT 35.5 (L) 05/07/2017   MCV 96.5 05/07/2017   PLT 223 05/07/2017   Lab Results  Component Value Date   NA 139 05/07/2017   K 3.3 (L) 05/07/2017   CO2 22 05/07/2017   GLUCOSE 82 05/07/2017   BUN 8 05/07/2017   CREATININE 0.48 05/07/2017   BILITOT 0.7 05/07/2017   ALKPHOS 50 05/07/2017   AST 19 05/07/2017   ALT 12 (L) 05/07/2017   PROT 7.6 05/07/2017    ALBUMIN 4.1 05/07/2017   CALCIUM 8.8 (L) 05/07/2017   ANIONGAP 11 05/07/2017       Assessment & Plan:   Problem List Items Addressed This Visit      Other   Pelvic pain - Primary    Pelvic pain Patient is a 29 year old female presents to clinic with pelvic pain.  Vaginal bleeding started and ended June 4. Patient has not had nay bleeding since then.  This is new for patient and its progressively getting worse.  Patient is unsure if she is pregnant.  History of tubal ligation and kidney stones.  pregnancy test at home positive, 3 following pregnancy test negative.  Patient is reporting a pain of 4 out of 10 from a pain scale of 0-10.  Symptoms include lower abdominal pain.  Intermittent Vaginal bleeding. patient denies nausea, vomiting and fever.  No CVA tenderness.  Patient refused pelvic exam.  Ultrasound ordered complete pelvic, abdomen, transvaginal.          Relevant Orders   Urine Culture   Urinalysis, Complete   Beta hCG quant (ref lab)   US Pelvis Complete   US Abdomen Complete   US OB Transvaginal       No orders of the defined types were placed in this encounter.    Ivy Lynn, NP

## 2019-08-19 NOTE — Addendum Note (Signed)
Addended by: Zannie Cove on: 08/19/2019 02:41 PM   Modules accepted: Orders

## 2019-08-19 NOTE — Addendum Note (Signed)
Addended by: Zannie Cove on: 08/19/2019 02:29 PM   Modules accepted: Orders

## 2019-08-19 NOTE — Assessment & Plan Note (Addendum)
Patient is a 29 year old female presents to clinic with pelvic pain.  Vaginal bleeding started and ended June 4. Patient has not had nay bleeding since then.  This is new for patient and its progressively getting worse.  Patient is unsure if she is pregnant.  History of tubal ligation and kidney stones.  pregnancy test at home positive, 3 following pregnancy test negative.  Patient is reporting a pain of 4 out of 10 from a pain scale of 0-10.  Symptoms include lower abdominal pain.  Intermittent Vaginal bleeding. patient denies nausea, vomiting and fever.  No CVA tenderness.  Patient refused pelvic exam.  Ultrasound ordered complete pelvic, abdomen, transvaginal.

## 2019-08-19 NOTE — Addendum Note (Signed)
Addended by: Milas Hock on: 08/19/2019 03:00 PM   Modules accepted: Orders

## 2019-08-19 NOTE — Telephone Encounter (Signed)
I reviewed HCG level from lab today and aware that Ardeen Fillers will review the rest soon.  She is headed to [facility] now for [place]

## 2019-08-19 NOTE — Addendum Note (Signed)
Addended by: Ivy Lynn on: 08/19/2019 02:27 PM   Modules accepted: Orders

## 2019-08-19 NOTE — Patient Instructions (Addendum)
Pelvic pain Patient is a 29 year old female presents to clinic with pelvic pain.  Vaginal bleeding started and ended June 4. Patient has not had nay bleeding since then.  This is new for patient and its progressively getting worse.  Patient is unsure if she is pregnant.  History of tubal ligation and kidney stones.  pregnancy test at home positive, 3 following pregnancy test negative.  Patient is reporting a pain of 4 out of 10 from a pain scale of 0-10.  Symptoms include lower abdominal pain.  Intermittent Vaginal bleeding. patient denies nausea, vomiting and fever.  No CVA tenderness.  Patient refused pelvic exam.  Ultrasound ordered complete pelvic, abdomen, transvaginal.        Pelvic Inflammatory Disease  Pelvic inflammatory disease (PID) is an infection in some or all of the female reproductive organs. PID can be in the womb (uterus), ovaries, fallopian tubes, or nearby tissues that are inside the lower belly area (pelvis). PID can lead to problems if it is not treated. What are the causes?  Germs (bacteria) that are spread during sex. This is the most common cause.  Germs in the vagina that are not spread during sex.  Germs that travel up from the vagina or cervix to the reproductive organs after: ? The birth of a baby. ? A miscarriage. ? An abortion. ? Pelvic surgery. ? Insertion of an intrauterine device (IUD). ? A sexual assault. What increases the risk?  Being younger than 31 years old.  Having sex at a young age.  Having a history of STI (sexually transmitted infection) or PID.  Not using barrier birth control, such as condoms.  Having a lot of sex partners.  Having sex with someone who has symptoms of an STI.  Using a douche.  Having an IUD put in place. What are the signs or symptoms?  Pain in the belly area.  Fever.  Chills.  Discharge from the vagina that is not normal.  Bleeding from the womb that is not normal.  Pain soon after the end of a  menstrual period.  Pain when you pee (urinate).  Pain with sex.  Feeling sick to your stomach (nauseous) or throwing up (vomiting). How is this treated?  Antibiotic medicines. In very bad cases, these may be given through an IV tube.  Surgery. This is rare.  Efforts to stop the spread of the infection. Sex partners may need to be treated. It may take weeks until you feel all better. Your doctor may test you for infection again after you finish treatment. You should also be checked for HIV (human immunodeficiency virus). Follow these instructions at home:  Take over-the-counter and prescription medicines only as told by your doctor.  If you were prescribed an antibiotic medicine, take it as told by your doctor. Do not stop taking it even if you start to feel better.  Do not have sex until treatment is done or as told by your doctor.  Tell your sex partner if you have PID. Your partner may need to be treated.  Keep all follow-up visits as told by your doctor. This is important. Contact a doctor if:  You have more fluid or fluid that is not normal coming from your vagina.  Your pain does not improve.  You throw up.  You have a fever.  You cannot take your medicines.  Your partner has an STI.  You have pain when you pee. Get help right away if:  You have more pain in  the belly area.  You have chills.  You are not better in 72 hours with treatment. Summary  Pelvic inflammatory disease (PID) is caused by an infection in some or all of the female reproductive organs.  PID is a serious infection.  This infection is most often treated with antibiotics.  Do not have sex until treatment is done or as told by your doctor. This information is not intended to replace advice given to you by your health care provider. Make sure you discuss any questions you have with your health care provider. Document Revised: 11/05/2017 Document Reviewed: 11/11/2017 Elsevier Patient  Education  [place].

## 2019-08-20 LAB — URINE CULTURE: Organism ID, Bacteria: NO GROWTH

## 2019-08-21 ENCOUNTER — Other Ambulatory Visit: Payer: Self-pay | Admitting: Nurse Practitioner

## 2019-08-21 MED ORDER — NAPROXEN 500 MG PO TABS: 500.0000 mg | ORAL_TABLET | Freq: Two times a day (BID) | ORAL | 0 refills | Status: DC

## 2019-08-21 NOTE — Addendum Note (Signed)
Addended by: Ivy Lynn on: 08/21/2019 11:01 PM   Modules accepted: Orders

## 2019-08-23 LAB — URINE CYTOLOGY ANCILLARY ONLY
Chlamydia: NEGATIVE
Comment: NEGATIVE
Comment: NEGATIVE
Comment: NORMAL
Neisseria Gonorrhea: NEGATIVE
Trichomonas: NEGATIVE

## 2019-11-01 ENCOUNTER — Encounter: Payer: Self-pay | Admitting: Family Medicine

## 2019-11-03 ENCOUNTER — Ambulatory Visit (INDEPENDENT_AMBULATORY_CARE_PROVIDER_SITE_OTHER): Payer: Medicaid Other | Admitting: Nurse Practitioner

## 2019-11-03 ENCOUNTER — Other Ambulatory Visit: Payer: Self-pay

## 2019-11-03 DIAGNOSIS — H6505 Acute serous otitis media, recurrent, left ear: Secondary | ICD-10-CM | POA: Diagnosis not present

## 2019-11-03 MED ORDER — AMOXICILLIN 875 MG PO TABS: 875.0000 mg | ORAL_TABLET | Freq: Two times a day (BID) | ORAL | 0 refills | Status: DC

## 2019-11-03 NOTE — Progress Notes (Signed)
Virtual Visit via telephone Note Due to COVID-19 pandemic this visit was conducted virtually. This visit type was conducted due to national recommendations for restrictions regarding the COVID-19 Pandemic (e.g. social distancing, sheltering in place) in an effort to limit this patient's exposure and mitigate transmission in our community. All issues noted in this document were discussed and addressed.  A physical exam was not performed with this format.  I connected with Vanessa Villanueva on 11/03/19 at 10:00 by telephone and verified that I am speaking with the correct person using two identifiers. Vanessa Villanueva is currently located at home and no one is currently with  her during visit. The provider, Mary-Margaret Hassell Done, FNP is located in their office at time of visit.  I discussed the limitations, risks, security and privacy concerns of performing an evaluation and management service by telephone and the availability of in person appointments. I also discussed with the patient that there may be a patient responsible charge related to this service. The patient expressed understanding and agreed to proceed.   History and Present Illness:   Chief Complaint: Sore Throat   HPI Patient calls in c/o of left ear pain that started 3 days ago. Has a swollen knot on that side of neck. She denies fever. She has not been swimming.  third time with ear infection in the last 6 months.- wants ENT referral. Review of Systems  Constitutional: Negative for chills and fever.  HENT: Positive for ear pain. Negative for congestion, ear discharge, sinus pain and sore throat.   Respiratory: Negative for cough.   Neurological: Negative for headaches.  All other systems reviewed and are negative.    Observations/Objective: Alert and oriented- answers all questions appropriately No distress No tragus tenderness- no auricle tenderness  Assessment and Plan: [name] in today with chief  complaint of Sore Throat   1. Recurrent acute serous otitis media of left ear motirn or tylenol OTC Meds ordered this encounter  Medications  . amoxicillin (AMOXIL) 875 MG tablet    Sig: Take 1 tablet (875 mg total) by mouth 2 (two) times daily. 1 po BID    Dispense:  20 tablet    Refill:  0    Order Specific Question:   Supervising Provider    Answer:   Caryl Pina A [[id number]]     Orders Placed This Encounter  Procedures  . Ambulatory referral to ENT    Referral Priority:   Routine    Referral Type:   Consultation    Referral Reason:   Specialty Services Required    Requested Specialty:   Otolaryngology    Number of Visits Requested:   1     Follow Up Instructions: prn    I discussed the assessment and treatment plan with the patient. The patient was provided an opportunity to ask questions and all were answered. The patient agreed with the plan and demonstrated an understanding of the instructions.   The patient was advised to call back or seek an in-person evaluation if the symptoms worsen or if the condition fails to improve as anticipated.  The above assessment and management plan was discussed with the patient. The patient verbalized understanding of and has agreed to the management plan. Patient is aware to call the clinic if symptoms persist or worsen. Patient is aware when to return to the clinic for a follow-up visit. Patient educated on when it is appropriate to go to the emergency department.   Time call  ended:  10:15  I provided 15 minutes of non-face-to-face time during this encounter.    Mary-Margaret Hassell Done, FNP

## 2019-12-26 ENCOUNTER — Other Ambulatory Visit: Payer: Self-pay

## 2019-12-26 ENCOUNTER — Ambulatory Visit: Payer: Medicaid Other | Admitting: Family

## 2019-12-26 ENCOUNTER — Encounter: Payer: Self-pay | Admitting: Family

## 2019-12-26 VITALS — BP 97/67 | HR 87 | Temp 97.8°F | Ht 61.5 in | Wt 128.6 lb

## 2019-12-26 DIAGNOSIS — R3915 Urgency of urination: Secondary | ICD-10-CM

## 2019-12-26 DIAGNOSIS — N926 Irregular menstruation, unspecified: Secondary | ICD-10-CM | POA: Diagnosis not present

## 2019-12-26 LAB — MICROSCOPIC EXAMINATION

## 2019-12-26 LAB — URINALYSIS, COMPLETE
Bilirubin, UA: NEGATIVE
Glucose, UA: NEGATIVE
Ketones, UA: NEGATIVE
Nitrite, UA: NEGATIVE
Protein,UA: NEGATIVE
Specific Gravity, UA: 1.025 (ref 1.005–1.030)
Urobilinogen, Ur: 0.2 mg/dL (ref 0.2–1.0)
pH, UA: 5.5 (ref 5.0–7.5)

## 2019-12-26 LAB — PREGNANCY, URINE: Preg Test, Ur: NEGATIVE

## 2019-12-26 NOTE — Patient Instructions (Signed)
Primary Amenorrhea  Primary amenorrhea is when a female has not started having periods by the age of 39 years. What are the causes? This condition may be caused by:  An abnormal chromosome that causes the ovaries not to work properly (common).  Malnutrition.  Low blood sugar (hypoglycemia).  Polycystic ovary syndrome.  Being born without a vagina, uterus, or ovaries.  Extreme obesity.  Cystic fibrosis.  Drastic weight loss.  Over-exercising that leads to a loss of body fat.  Pituitary gland tumor in the brain.  Long-term illnesses.  Cushing disease.  A thyroid disease, such as hypothyroidism or hyperthyroidism.  A part of the brain called the hypothalamus not working normally.  Premature ovarian failure. What are the signs or symptoms? The main symptom of this condition is not having had a period by age 61 years. Other symptoms include:  Discharge from the breasts.  Hot flashes.  Adult acne.  Facial or chest hair.  Headaches.  Impaired vision.  Recent excessive stress.  Changes in weight, diet, or exercise patterns. How is this diagnosed? This condition may be diagnosed based on:  Your medical history.  A physical exam.  Tests, such as: ? Blood tests. ? Urine tests. How is this treated? Treatment for this condition depends on the cause. For example, an absence of sex organs will require surgery to correct the problem. Others causes may respond when treated with medicine. Follow these instructions at home:  Maintain a healthy diet.  Maintain a healthy weight.  Exercise regularly but not excessively.  Take over-the-counter and prescription medicines only as told by your health care provider.  Keep all follow-up visits as told by your health care provider. This is important. Contact a health care provider if:  Your body is not developing at the level of your peers.  You have pelvic pain.  You gain an unusual amount of weight.  You have  an unusual amount of hair growth. Summary  Primary amenorrhea is when a female has not started having periods by the age of 43 years.  This condition has many causes, including abnormal ovaries, obesity, extreme weight loss.  Contact a health care provider if your body is not developing at the level of your peers. This information is not intended to replace advice given to you by your health care provider. Make sure you discuss any questions you have with your health care provider. Document Revised: 08/03/2018 Document Reviewed: 05/06/2016 Elsevier Patient Education  [place].

## 2019-12-26 NOTE — Progress Notes (Signed)
   Subjective:    Patient ID: Vanessa Villanueva, female    DOB: 1990/11/28, 29 y.o.   MRN: [id number]  Chief Complaint  Patient presents with  . Menstrual Problem    missed peroid, cramps, Patient states she has pregnancy symptoms   . Nausea    HPI Pt presents to the office today with missed menstrual cycle. States her last period was August 25 th, but had spotting for 4 days in September. She reports slight nausea in the morning and slight breast tenderness. She reports increased urgency in urination. She also reports fatigue.   She reports she took 2 home pregnancy tests and they were negative.   She reports her tubes are tied. However, with one of her past children she states she had spotting and negative home tests and finally went to the doctor and was 4 months pregnant.   Review of Systems  All other systems reviewed and are negative.      Objective:   Physical Exam Vitals reviewed.  Constitutional:      General: She is not in acute distress.    Appearance: She is well-developed.  HENT:     Head: Normocephalic and atraumatic.     Right Ear: Tympanic membrane normal.     Left Ear: Tympanic membrane normal.  Eyes:     Pupils: Pupils are equal, round, and reactive to light.  Neck:     Thyroid: No thyromegaly.  Cardiovascular:     Rate and Rhythm: Normal rate and regular rhythm.     Heart sounds: Normal heart sounds. No murmur heard.   Pulmonary:     Effort: Pulmonary effort is normal. No respiratory distress.     Breath sounds: Normal breath sounds. No wheezing.  Abdominal:     General: Bowel sounds are normal. There is no distension.     Palpations: Abdomen is soft.     Tenderness: There is no abdominal tenderness.  Musculoskeletal:        General: No tenderness. Normal range of motion.     Cervical back: Normal range of motion and neck supple.  Skin:    General: Skin is warm and dry.  Neurological:     Mental Status: She is alert and oriented to person,  place, and time.     Cranial Nerves: No cranial nerve deficit.     Deep Tendon Reflexes: Reflexes are normal and symmetric.  Psychiatric:        Behavior: Behavior normal.        Thought Content: Thought content normal.        Judgment: Judgment normal.     BP 97/67   Pulse 87   Temp 97.8 F (36.6 C) (Temporal)   Ht 5' 1.5" (1.562 m)   Wt 128 lb 9.6 oz (58.3 kg)   LMP 10/26/2019   SpO2 99%   BMI 23.91 kg/m        Assessment & Plan:  Renise A Giannini comes in today with chief complaint of Menstrual Problem (missed peroid, cramps, Patient states she has pregnancy symptoms ) and Nausea   Diagnosis and orders addressed:  1. Missed period - POCT urine pregnancy - Beta hCG quant (ref lab) - Urinalysis, Complete - Urine Culture  2. Urinary urgency - Beta hCG quant (ref lab) - Urinalysis, Complete - Urine Culture   Labs pending Health Maintenance reviewed Diet and exercise encouraged     Evelina Dun, FNP

## 2019-12-27 LAB — URINE CULTURE

## 2019-12-27 LAB — BETA HCG QUANT (REF LAB): hCG Quant: <1 m[IU]/mL

## 2020-05-14 ENCOUNTER — Ambulatory Visit: Payer: Medicaid Other | Admitting: Family

## 2020-06-01 ENCOUNTER — Telehealth: Payer: Self-pay

## 2020-06-01 NOTE — Telephone Encounter (Signed)
Spoke with patient given appointment Monday with [place]. Patient was in agreeable with appointment

## 2020-06-04 ENCOUNTER — Encounter: Payer: Self-pay | Admitting: Family

## 2020-06-04 ENCOUNTER — Ambulatory Visit (INDEPENDENT_AMBULATORY_CARE_PROVIDER_SITE_OTHER): Payer: Medicaid Other | Admitting: Family

## 2020-06-04 ENCOUNTER — Other Ambulatory Visit: Payer: Self-pay

## 2020-06-04 VITALS — BP 113/72 | HR 83 | Temp 98.4°F | Ht 61.5 in | Wt 133.4 lb

## 2020-06-04 DIAGNOSIS — F411 Generalized anxiety disorder: Secondary | ICD-10-CM

## 2020-06-04 DIAGNOSIS — F321 Major depressive disorder, single episode, moderate: Secondary | ICD-10-CM | POA: Diagnosis not present

## 2020-06-04 MED ORDER — ESCITALOPRAM OXALATE 10 MG PO TABS: 10.0000 mg | ORAL_TABLET | Freq: Every day | ORAL | 3 refills | Status: DC

## 2020-06-04 MED ORDER — BUSPIRONE HCL 5 MG PO TABS: 5.0000 mg | ORAL_TABLET | Freq: Three times a day (TID) | ORAL | 2 refills | Status: DC | PRN

## 2020-06-04 NOTE — Progress Notes (Signed)
Subjective:    Patient ID: Vanessa Villanueva, female    DOB: February 17, 1991, 30 y.o.   MRN: [id number]  Chief Complaint  Patient presents with  . Depression  . Anxiety   Pt presents to the office today for GAD and depression. She states she is under a great deal of stress and her oldest son that is 34 years old and may go to jail for larceny charges.  Depression        This is a chronic problem.  The current episode started more than 1 year ago.   The problem occurs intermittently.  Associated symptoms include helplessness, hopelessness, irritable, restlessness, decreased interest and sad.     The symptoms are aggravated by family issues.  Past treatments include nothing.  Past medical history includes anxiety.   Anxiety Presents for follow-up visit. Symptoms include depressed mood, excessive worry, irritability, nervous/anxious behavior, panic and restlessness. Symptoms occur occasionally. The severity of symptoms is moderate.        Review of Systems  Constitutional: Positive for irritability.  Psychiatric/Behavioral: Positive for depression. The patient is nervous/anxious.   All other systems reviewed and are negative.      Objective:   Physical Exam Vitals reviewed.  Constitutional:      General: She is irritable. She is not in acute distress.    Appearance: She is well-developed.  HENT:     Head: Normocephalic and atraumatic.     Right Ear: Tympanic membrane normal.     Left Ear: Tympanic membrane normal.  Eyes:     Pupils: Pupils are equal, round, and reactive to light.  Neck:     Thyroid: No thyromegaly.  Cardiovascular:     Rate and Rhythm: Normal rate and regular rhythm.     Heart sounds: Normal heart sounds. No murmur heard.   Pulmonary:     Effort: Pulmonary effort is normal. No respiratory distress.     Breath sounds: Normal breath sounds. No wheezing.  Abdominal:     General: Bowel sounds are normal. There is no distension.     Palpations: Abdomen is  soft.     Tenderness: There is no abdominal tenderness.  Musculoskeletal:        General: No tenderness. Normal range of motion.     Cervical back: Normal range of motion and neck supple.  Skin:    General: Skin is warm and dry.  Neurological:     Mental Status: She is alert and oriented to person, place, and time.     Cranial Nerves: No cranial nerve deficit.     Deep Tendon Reflexes: Reflexes are normal and symmetric.  Psychiatric:        Mood and Affect: Mood is depressed. Affect is flat and tearful.        Behavior: Behavior normal.        Thought Content: Thought content normal.        Judgment: Judgment normal.       BP 113/72   Pulse 83   Temp 98.4 F (36.9 C)   Ht 5' 1.5" (1.562 m)   Wt 133 lb 6.4 oz (60.5 kg)   LMP 05/17/2020   SpO2 100%   Breastfeeding No   BMI 24.80 kg/m      Assessment & Plan:  Sallee A Billick comes in today with chief complaint of Depression and Anxiety   Diagnosis and orders addressed:  1. GAD (generalized anxiety disorder) - escitalopram (LEXAPRO) 10 MG tablet; Take 1 tablet (  10 mg total) by mouth daily.  Dispense: 90 tablet; Refill: 3 - busPIRone (BUSPAR) 5 MG tablet; Take 1 tablet (5 mg total) by mouth 3 (three) times daily as needed.  Dispense: 90 tablet; Refill: 2  2. Depression, major, single episode, moderate ([facility]) - escitalopram (LEXAPRO) 10 MG tablet; Take 1 tablet (10 mg total) by mouth daily.  Dispense: 90 tablet; Refill: 3 - busPIRone (BUSPAR) 5 MG tablet; Take 1 tablet (5 mg total) by mouth 3 (three) times daily as needed.  Dispense: 90 tablet; Refill: 2   Will start Lexapro 10 mg and Buspar 5 mg TID prn  Stress management  Will get her scheduled for Gene testing RTO in 1 month  Evelina Dun, FNP

## 2020-06-04 NOTE — Patient Instructions (Signed)
[email or web address].[email or web address].Gov">  Generalized Anxiety Disorder, Adult Generalized anxiety disorder (GAD) is a mental health condition. Unlike normal worries, anxiety related to GAD is not triggered by a specific event. These worries do not fade or get better with time. GAD interferes with relationships, work, and school. GAD symptoms can vary from mild to severe. People with severe GAD can have intense waves of anxiety with physical symptoms that are similar to panic attacks. What are the causes? The exact cause of GAD is not known, but the following are believed to have an impact:  Differences in natural brain chemicals.  Genes passed down from parents to children.  Differences in the way threats are perceived.  Development during childhood.  Personality. What increases the risk? The following factors may make you more likely to develop this condition:  Being female.  Having a family history of anxiety disorders.  Being very shy.  Experiencing very stressful life events, such as the death of a loved one.  Having a very stressful family environment. What are the signs or symptoms? People with GAD often worry excessively about many things in their lives, such as their health and family. Symptoms may also include:  Mental and emotional symptoms: ? Worrying excessively about natural disasters. ? Fear of being late. ? Difficulty concentrating. ? Fears that others are judging your performance.  Physical symptoms: ? Fatigue. ? Headaches, muscle tension, muscle twitches, trembling, or feeling shaky. ? Feeling like your heart is pounding or beating very fast. ? Feeling out of breath or like you cannot take a deep breath. ? Having trouble falling asleep or staying asleep, or experiencing restlessness. ? Sweating. ? Nausea, diarrhea, or irritable bowel syndrome (IBS).  Behavioral symptoms: ? Experiencing erratic moods or irritability. ? Avoidance of new situations. ? Avoidance of  people. ? Extreme difficulty making decisions. How is this diagnosed? This condition is diagnosed based on your symptoms and medical history. You will also have a physical exam. Your health care provider may perform tests to rule out other possible causes of your symptoms. To be diagnosed with GAD, a person must have anxiety that:  Is out of his or her control.  Affects several different aspects of his or her life, such as work and relationships.  Causes distress that makes him or her unable to take part in normal activities.  Includes at least three symptoms of GAD, such as restlessness, fatigue, trouble concentrating, irritability, muscle tension, or sleep problems. Before your health care provider can confirm a diagnosis of GAD, these symptoms must be present more days than they are not, and they must last for 6 months or longer. How is this treated? This condition may be treated with:  Medicine. Antidepressant medicine is usually prescribed for long-term daily control. Anti-anxiety medicines may be added in severe cases, especially when panic attacks occur.  Talk therapy (psychotherapy). Certain types of talk therapy can be helpful in treating GAD by providing support, education, and guidance. Options include: ? Cognitive behavioral therapy (CBT). People learn coping skills and self-calming techniques to ease their physical symptoms. They learn to identify unrealistic thoughts and behaviors and to replace them with more appropriate thoughts and behaviors. ? Acceptance and commitment therapy (ACT). This treatment teaches people how to be mindful as a way to cope with unwanted thoughts and feelings. ? Biofeedback. This process trains you to manage your body's response (physiological response) through breathing techniques and relaxation methods. You will work with a therapist while machines are used to monitor your physical   symptoms.  Stress management techniques. These include yoga,  meditation, and exercise. A mental health specialist can help determine which treatment is best for you. Some people see improvement with one type of therapy. However, other people require a combination of therapies.   Follow these instructions at home: Lifestyle  Maintain a consistent routine and schedule.  Anticipate stressful situations. Create a plan, and allow extra time to work with your plan.  Practice stress management or self-calming techniques that you have learned from your therapist or your health care provider. General instructions  Take over-the-counter and prescription medicines only as told by your health care provider.  Understand that you are likely to have setbacks. Accept this and be kind to yourself as you persist to take better care of yourself.  Recognize and accept your accomplishments, even if you judge them as small.  Keep all follow-up visits as told by your health care provider. This is important. Contact a health care provider if:  Your symptoms do not get better.  Your symptoms get worse.  You have signs of depression, such as: ? A persistently sad or irritable mood. ? Loss of enjoyment in activities that used to bring you joy. ? Change in weight or eating. ? Changes in sleeping habits. ? Avoiding friends or family members. ? Loss of energy for normal tasks. ? Feelings of guilt or worthlessness. Get help right away if:  You have serious thoughts about hurting yourself or others. If you ever feel like you may hurt yourself or others, or have thoughts about taking your own life, get help right away. Go to your nearest emergency department or:  Call your local emergency services (911 in the [place]  Call a suicide crisis helpline, such as the National Suicide Prevention Lifeline at 1-800-273-8255. This is open 24 hours a day in the [place]  Text the Crisis Text Line at 741741 (in the [place] Summary  Generalized anxiety disorder (GAD) is a mental  health condition that involves worry that is not triggered by a specific event.  People with GAD often worry excessively about many things in their lives, such as their health and family.  GAD may cause symptoms such as restlessness, trouble concentrating, sleep problems, frequent sweating, nausea, diarrhea, headaches, and trembling or muscle twitching.  A mental health specialist can help determine which treatment is best for you. Some people see improvement with one type of therapy. However, other people require a combination of therapies. This information is not intended to replace advice given to you by your health care provider. Make sure you discuss any questions you have with your health care provider. Document Revised: 12/08/2018 Document Reviewed: 12/08/2018 Elsevier Patient Education  2021 Elsevier Inc.  

## 2020-06-25 ENCOUNTER — Ambulatory Visit: Payer: Medicaid Other | Admitting: Family

## 2020-06-26 ENCOUNTER — Ambulatory Visit: Payer: Medicaid Other | Admitting: Family Medicine

## 2020-06-26 ENCOUNTER — Encounter: Payer: Self-pay | Admitting: Family Medicine

## 2020-06-26 ENCOUNTER — Other Ambulatory Visit: Payer: Self-pay

## 2020-06-26 VITALS — BP 111/71 | HR 64 | Temp 97.4°F | Ht 61.5 in | Wt 134.6 lb

## 2020-06-26 DIAGNOSIS — F411 Generalized anxiety disorder: Secondary | ICD-10-CM

## 2020-06-26 DIAGNOSIS — F321 Major depressive disorder, single episode, moderate: Secondary | ICD-10-CM

## 2020-06-26 NOTE — Progress Notes (Signed)
   Assessment & Plan:  1-2. GAD (generalized anxiety disorder)/Depression, major, single episode, moderate ([place]) GeneSight testing completed today.   Follow up plan: Return as scheuled with PCP.  Hendricks Limes, MSN, APRN, FNP-C [facility]  Subjective:   Patient ID: Vanessa Villanueva, female    DOB: 08-15-1990, 30 y.o.   MRN: [id number]  HPI: Vanessa Villanueva is a 30 y.o. female presenting on 06/26/2020 for gene sight  Patient is here for GeneSight testing.  She is currently taking Lexapro and buspirone.  She has previously failed therapy with Celexa, Prozac, and Cymbalta.   ROS: Negative unless specifically indicated above in HPI.   Relevant past medical history reviewed and updated as indicated.   Allergies and medications reviewed and updated.   Current Outpatient Medications:  .  busPIRone (BUSPAR) 5 MG tablet, Take 1 tablet (5 mg total) by mouth 3 (three) times daily as needed., Disp: 90 tablet, Rfl: 2 .  escitalopram (LEXAPRO) 10 MG tablet, Take 1 tablet (10 mg total) by mouth daily., Disp: 90 tablet, Rfl: 3  Allergies  Allergen Reactions  . Codeine Nausea And Vomiting    Objective:   BP 111/71   Pulse 64   Temp (!) 97.4 F (36.3 C) (Temporal)   Ht 5' 1.5" (1.562 m)   Wt 134 lb 9.6 oz (61.1 kg)   SpO2 100%   BMI 25.02 kg/m    Physical Exam Vitals reviewed.  Constitutional:      General: She is not in acute distress.    Appearance: Normal appearance. She is not ill-appearing, toxic-appearing or diaphoretic.  HENT:     Head: Normocephalic and atraumatic.  Eyes:     General: No scleral icterus.       [name]: No discharge.        [name]: No discharge.     Conjunctiva/sclera: Conjunctivae normal.  Cardiovascular:     Rate and Rhythm: Normal rate.  Pulmonary:     Effort: Pulmonary effort is normal. No respiratory distress.  Musculoskeletal:        General: Normal range of motion.     Cervical back: Normal range of  motion.  Skin:    General: Skin is warm and dry.     Capillary Refill: Capillary refill takes less than 2 seconds.  Neurological:     General: No focal deficit present.     Mental Status: She is alert and oriented to person, place, and time. Mental status is at baseline.  Psychiatric:        Mood and Affect: Mood normal.        Behavior: Behavior normal.        Thought Content: Thought content normal.        Judgment: Judgment normal.

## 2020-06-29 ENCOUNTER — Other Ambulatory Visit: Payer: Self-pay | Admitting: Family

## 2020-07-02 ENCOUNTER — Encounter: Payer: Self-pay | Admitting: Family Medicine

## 2020-07-03 ENCOUNTER — Ambulatory Visit: Payer: Medicaid Other | Admitting: Family

## 2020-07-03 ENCOUNTER — Encounter: Payer: Self-pay | Admitting: Family

## 2020-07-03 ENCOUNTER — Other Ambulatory Visit: Payer: Self-pay

## 2020-07-03 VITALS — BP 114/76 | HR 77 | Temp 97.8°F | Ht 61.5 in | Wt 133.4 lb

## 2020-07-03 DIAGNOSIS — F411 Generalized anxiety disorder: Secondary | ICD-10-CM

## 2020-07-03 DIAGNOSIS — F321 Major depressive disorder, single episode, moderate: Secondary | ICD-10-CM | POA: Diagnosis not present

## 2020-07-03 MED ORDER — ESCITALOPRAM OXALATE 20 MG PO TABS: 20.0000 mg | ORAL_TABLET | Freq: Every day | ORAL | 1 refills | Status: DC

## 2020-07-03 MED ORDER — BUSPIRONE HCL 7.5 MG PO TABS: 7.5000 mg | ORAL_TABLET | Freq: Three times a day (TID) | ORAL | 2 refills | Status: DC | PRN

## 2020-07-03 NOTE — Patient Instructions (Signed)
[email or web address].[email or web address].Gov">  Generalized Anxiety Disorder, Adult Generalized anxiety disorder (GAD) is a mental health condition. Unlike normal worries, anxiety related to GAD is not triggered by a specific event. These worries do not fade or get better with time. GAD interferes with relationships, work, and school. GAD symptoms can vary from mild to severe. People with severe GAD can have intense waves of anxiety with physical symptoms that are similar to panic attacks. What are the causes? The exact cause of GAD is not known, but the following are believed to have an impact:  Differences in natural brain chemicals.  Genes passed down from parents to children.  Differences in the way threats are perceived.  Development during childhood.  Personality. What increases the risk? The following factors may make you more likely to develop this condition:  Being female.  Having a family history of anxiety disorders.  Being very shy.  Experiencing very stressful life events, such as the death of a loved one.  Having a very stressful family environment. What are the signs or symptoms? People with GAD often worry excessively about many things in their lives, such as their health and family. Symptoms may also include:  Mental and emotional symptoms: ? Worrying excessively about natural disasters. ? Fear of being late. ? Difficulty concentrating. ? Fears that others are judging your performance.  Physical symptoms: ? Fatigue. ? Headaches, muscle tension, muscle twitches, trembling, or feeling shaky. ? Feeling like your heart is pounding or beating very fast. ? Feeling out of breath or like you cannot take a deep breath. ? Having trouble falling asleep or staying asleep, or experiencing restlessness. ? Sweating. ? Nausea, diarrhea, or irritable bowel syndrome (IBS).  Behavioral symptoms: ? Experiencing erratic moods or irritability. ? Avoidance of new situations. ? Avoidance of  people. ? Extreme difficulty making decisions. How is this diagnosed? This condition is diagnosed based on your symptoms and medical history. You will also have a physical exam. Your health care provider may perform tests to rule out other possible causes of your symptoms. To be diagnosed with GAD, a person must have anxiety that:  Is out of his or her control.  Affects several different aspects of his or her life, such as work and relationships.  Causes distress that makes him or her unable to take part in normal activities.  Includes at least three symptoms of GAD, such as restlessness, fatigue, trouble concentrating, irritability, muscle tension, or sleep problems. Before your health care provider can confirm a diagnosis of GAD, these symptoms must be present more days than they are not, and they must last for 6 months or longer. How is this treated? This condition may be treated with:  Medicine. Antidepressant medicine is usually prescribed for long-term daily control. Anti-anxiety medicines may be added in severe cases, especially when panic attacks occur.  Talk therapy (psychotherapy). Certain types of talk therapy can be helpful in treating GAD by providing support, education, and guidance. Options include: ? Cognitive behavioral therapy (CBT). People learn coping skills and self-calming techniques to ease their physical symptoms. They learn to identify unrealistic thoughts and behaviors and to replace them with more appropriate thoughts and behaviors. ? Acceptance and commitment therapy (ACT). This treatment teaches people how to be mindful as a way to cope with unwanted thoughts and feelings. ? Biofeedback. This process trains you to manage your body's response (physiological response) through breathing techniques and relaxation methods. You will work with a therapist while machines are used to monitor your physical   symptoms.  Stress management techniques. These include yoga,  meditation, and exercise. A mental health specialist can help determine which treatment is best for you. Some people see improvement with one type of therapy. However, other people require a combination of therapies.   Follow these instructions at home: Lifestyle  Maintain a consistent routine and schedule.  Anticipate stressful situations. Create a plan, and allow extra time to work with your plan.  Practice stress management or self-calming techniques that you have learned from your therapist or your health care provider. General instructions  Take over-the-counter and prescription medicines only as told by your health care provider.  Understand that you are likely to have setbacks. Accept this and be kind to yourself as you persist to take better care of yourself.  Recognize and accept your accomplishments, even if you judge them as small.  Keep all follow-up visits as told by your health care provider. This is important. Contact a health care provider if:  Your symptoms do not get better.  Your symptoms get worse.  You have signs of depression, such as: ? A persistently sad or irritable mood. ? Loss of enjoyment in activities that used to bring you joy. ? Change in weight or eating. ? Changes in sleeping habits. ? Avoiding friends or family members. ? Loss of energy for normal tasks. ? Feelings of guilt or worthlessness. Get help right away if:  You have serious thoughts about hurting yourself or others. If you ever feel like you may hurt yourself or others, or have thoughts about taking your own life, get help right away. Go to your nearest emergency department or:  Call your local emergency services (911 in the [place]  Call a suicide crisis helpline, such as the National Suicide Prevention Lifeline at 1-800-273-8255. This is open 24 hours a day in the [place]  Text the Crisis Text Line at 741741 (in the [place] Summary  Generalized anxiety disorder (GAD) is a mental  health condition that involves worry that is not triggered by a specific event.  People with GAD often worry excessively about many things in their lives, such as their health and family.  GAD may cause symptoms such as restlessness, trouble concentrating, sleep problems, frequent sweating, nausea, diarrhea, headaches, and trembling or muscle twitching.  A mental health specialist can help determine which treatment is best for you. Some people see improvement with one type of therapy. However, other people require a combination of therapies. This information is not intended to replace advice given to you by your health care provider. Make sure you discuss any questions you have with your health care provider. Document Revised: 12/08/2018 Document Reviewed: 12/08/2018 Elsevier Patient Education  2021 Elsevier Inc.  

## 2020-07-03 NOTE — Progress Notes (Signed)
Subjective:    Patient ID: Vanessa Villanueva, female    DOB: 1990/08/16, 30 y.o.   MRN: [id number]  Chief Complaint  Patient presents with  . Anxiety    Go over Genes[facility] teasting results    Pt presents to the office today to discuss GAD and depression. She is currently taking Lexapro 10 mg.  She states her stress is increased and her 17 year old son is in the process of going to court and going to a group home.   She had a [facility] test performed and Lexapro was on her list that she should respond well to.  Anxiety Presents for follow-up visit. Symptoms include depressed mood, excessive worry, irritability, nervous/anxious behavior and restlessness. Symptoms occur occasionally. The severity of symptoms is moderate.    Depression        This is a chronic problem.  The current episode started more than 1 year ago.   The onset quality is gradual.   The problem occurs intermittently.  Associated symptoms include helplessness, hopelessness, irritable, restlessness, decreased interest and sad.  Past treatments include SSRIs - Selective serotonin reuptake inhibitors.  Past medical history includes anxiety.       Review of Systems  Constitutional: Positive for irritability.  Psychiatric/Behavioral: Positive for depression. The patient is nervous/anxious.   All other systems reviewed and are negative.      Objective:   Physical Exam Vitals reviewed.  Constitutional:      General: She is irritable. She is not in acute distress.    Appearance: She is well-developed.  HENT:     Head: Normocephalic and atraumatic.     Right Ear: Tympanic membrane normal.     Left Ear: Tympanic membrane normal.  Eyes:     Pupils: Pupils are equal, round, and reactive to light.  Neck:     Thyroid: No thyromegaly.  Cardiovascular:     Rate and Rhythm: Normal rate and regular rhythm.     Heart sounds: Normal heart sounds. No murmur heard.   Pulmonary:     Effort: Pulmonary effort is normal. No  respiratory distress.     Breath sounds: Normal breath sounds. No wheezing.  Abdominal:     General: Bowel sounds are normal. There is no distension.     Palpations: Abdomen is soft.     Tenderness: There is no abdominal tenderness.  Musculoskeletal:        General: No tenderness. Normal range of motion.     Cervical back: Normal range of motion and neck supple.  Skin:    General: Skin is warm and dry.  Neurological:     Mental Status: She is alert and oriented to person, place, and time.     Cranial Nerves: No cranial nerve deficit.     Deep Tendon Reflexes: Reflexes are normal and symmetric.  Psychiatric:        Behavior: Behavior normal.        Thought Content: Thought content normal.        Judgment: Judgment normal.     BP 114/76   Pulse 77   Temp 97.8 F (36.6 C) (Temporal)   Ht 5' 1.5" (1.562 m)   Wt 133 lb 6.4 oz (60.5 kg)   BMI 24.80 kg/m        Assessment & Plan:  Araiyah A Hockman comes in today with chief complaint of Anxiety (Go over [facility] teasting results )   Diagnosis and orders addressed:  1. GAD (generalized anxiety disorder) -  escitalopram (LEXAPRO) 20 MG tablet; Take 1 tablet (20 mg total) by mouth daily.  Dispense: 90 tablet; Refill: 1 - busPIRone (BUSPAR) 7.5 MG tablet; Take 1 tablet (7.5 mg total) by mouth 3 (three) times daily as needed.  Dispense: 90 tablet; Refill: 2  2. Depression, major, single episode, moderate (HCC) - escitalopram (LEXAPRO) 20 MG tablet; Take 1 tablet (20 mg total) by mouth daily.  Dispense: 90 tablet; Refill: 1 - busPIRone (BUSPAR) 7.5 MG tablet; Take 1 tablet (7.5 mg total) by mouth 3 (three) times daily as needed.  Dispense: 90 tablet; Refill: 2   Will increase Lexapro to 20 mg from 10 mg  Will increase Buspar to 7.5 mg from 5 mg TID  Stress management  RTO in 6 weeks    Evelina Dun, FNP

## 2020-07-03 NOTE — Addendum Note (Signed)
Addended by: Evelina Dun A on: 07/03/2020 11:25 AM   Modules accepted: Level of Service

## 2020-07-06 ENCOUNTER — Other Ambulatory Visit: Payer: Self-pay | Admitting: Family

## 2020-07-06 DIAGNOSIS — F411 Generalized anxiety disorder: Secondary | ICD-10-CM

## 2020-07-06 DIAGNOSIS — F321 Major depressive disorder, single episode, moderate: Secondary | ICD-10-CM

## 2020-07-06 MED ORDER — BUSPIRONE HCL 7.5 MG PO TABS: 7.5000 mg | ORAL_TABLET | Freq: Three times a day (TID) | ORAL | 2 refills | Status: DC | PRN

## 2020-07-06 MED ORDER — BUSPIRONE HCL 15 MG PO TABS: 15.0000 mg | ORAL_TABLET | Freq: Three times a day (TID) | ORAL | 1 refills | Status: DC

## 2020-07-12 ENCOUNTER — Ambulatory Visit (INDEPENDENT_AMBULATORY_CARE_PROVIDER_SITE_OTHER): Payer: Medicaid Other | Admitting: Family Medicine

## 2020-07-12 ENCOUNTER — Encounter: Payer: Self-pay | Admitting: Family Medicine

## 2020-07-12 DIAGNOSIS — J020 Streptococcal pharyngitis: Secondary | ICD-10-CM

## 2020-07-12 DIAGNOSIS — J029 Acute pharyngitis, unspecified: Secondary | ICD-10-CM

## 2020-07-12 LAB — RAPID STREP SCREEN (MED CTR MEBANE ONLY): Strep Gp A Ag, IA W/Reflex: POSITIVE — AB

## 2020-07-12 LAB — VERITOR FLU A/B WAIVED
Influenza A: NEGATIVE
Influenza B: NEGATIVE

## 2020-07-12 MED ORDER — AMOXICILLIN 500 MG PO CAPS: 500.0000 mg | ORAL_CAPSULE | Freq: Two times a day (BID) | ORAL | 0 refills | Status: AC

## 2020-07-12 NOTE — Addendum Note (Signed)
Addended by: Gwenlyn Perking on: 07/12/2020 02:57 PM   Modules accepted: Orders

## 2020-07-12 NOTE — Progress Notes (Signed)
   Virtual Visit  Note Due to COVID-19 pandemic this visit was conducted virtually. This visit type was conducted due to national recommendations for restrictions regarding the COVID-19 Pandemic (e.g. social distancing, sheltering in place) in an effort to limit this patient's exposure and mitigate transmission in our community. All issues noted in this document were discussed and addressed.  A physical exam was not performed with this format.  I connected with Vanessa Villanueva on 07/12/20 at 1242 by telephone and verified that I am speaking with the correct person using two identifiers. Vanessa Villanueva is currently located in her car and her children are currently with her during the visit. The provider, Gwenlyn Perking, FNP is located in their office at time of visit.  I discussed the limitations, risks, security and privacy concerns of performing an evaluation and management service by telephone and the availability of in person appointments. I also discussed with the patient that there may be a patient responsible charge related to this service. The patient expressed understanding and agreed to proceed.  CC: sore throat  History and Present Illness:  HPI  Vanessa Villanueva reports waking up this morning with a sore throat. She reports her throat feels scratchy. The pain is worse with swallowing. Her throat looks red and her tonsils are swollen. She also reports white spots on her tonsils. Reports swollen lymph on the left side of her neck. She denies fever, cough, congestion, body aches, chills, nausea, vomiting, or diarrhea. Her children just tested positive for strep throat and the flu yesterday. They had had symptoms for a few days prior to being tested. Her children tested negative for Covid.    ROS As per HPI.   Observations/Objective: Alert and oriented x 3. Able to speak in full sentences without difficulty.    Assessment and Plan: Vanessa Villanueva was seen today for sore  throat.  Diagnoses and all orders for this visit:  Sore throat Exposure to strep and flu. She will come by the office for testing as below. Declined Covid testing. Will notify patient of results and send if abx if needed pending results. -     Rapid Strep Screen ([facility] ONLY) -     Veritor Flu A/B Waived  Follow Up Instructions: Return to office for new or worsening symptoms, or if symptoms persist.     I discussed the assessment and treatment plan with the patient. The patient was provided an opportunity to ask questions and all were answered. The patient agreed with the plan and demonstrated an understanding of the instructions.   The patient was advised to call back or seek an in-person evaluation if the symptoms worsen or if the condition fails to improve as anticipated.  The above assessment and management plan was discussed with the patient. The patient verbalized understanding of and has agreed to the management plan. Patient is aware to call the clinic if symptoms persist or worsen. Patient is aware when to return to the clinic for a follow-up visit. Patient educated on when it is appropriate to go to the emergency department.   Time call ended: 1253    I provided 11 minutes of  non face-to-face time during this encounter.    Gwenlyn Perking, FNP

## 2020-08-30 ENCOUNTER — Ambulatory Visit: Payer: Medicaid Other | Admitting: Family

## 2020-08-30 ENCOUNTER — Encounter: Payer: Self-pay | Admitting: Family

## 2020-08-30 ENCOUNTER — Other Ambulatory Visit: Payer: Self-pay

## 2020-08-30 VITALS — BP 90/67 | HR 83 | Temp 98.1°F | Ht 61.5 in | Wt 130.0 lb

## 2020-08-30 DIAGNOSIS — F321 Major depressive disorder, single episode, moderate: Secondary | ICD-10-CM

## 2020-08-30 DIAGNOSIS — F411 Generalized anxiety disorder: Secondary | ICD-10-CM | POA: Diagnosis not present

## 2020-08-30 DIAGNOSIS — I959 Hypotension, unspecified: Secondary | ICD-10-CM

## 2020-08-30 MED ORDER — BUSPIRONE HCL 7.5 MG PO TABS: 7.5000 mg | ORAL_TABLET | Freq: Four times a day (QID) | ORAL | 2 refills | Status: DC

## 2020-08-30 NOTE — Progress Notes (Signed)
Subjective:    Patient ID: Vanessa Villanueva, female    DOB: 08/15/1990, 30 y.o.   MRN: [id number]  Chief Complaint  Patient presents with   Medical Management of Chronic Issues   Pt presents to the office today to discuss GAD and depression. She is currently taking Lexapro 20 mg and Buspar 15 mg TID. Reports this makes her dizzy and she can only take 1/2 a tab.     She states her stress is increased and her 73 year old son is in the process of going to court and going to a group home.  BP slightly low today. She is crying and has not drink lot of fluids.  Anxiety Presents for follow-up visit. Symptoms include depressed mood, excessive worry, irritability, nervous/anxious behavior and restlessness. Symptoms occur most days. The severity of symptoms is moderate. The quality of sleep is good.    Depression        This is a chronic problem.  The current episode started more than 1 year ago.   The problem occurs intermittently.  Associated symptoms include helplessness, hopelessness, irritable, restlessness and sad.  Past treatments include SSRIs - Selective serotonin reuptake inhibitors.  Past medical history includes anxiety.      Review of Systems  Constitutional:  Positive for irritability.  Psychiatric/Behavioral:  Positive for depression. The patient is nervous/anxious.   All other systems reviewed and are negative.     Objective:   Physical Exam Vitals reviewed.  Constitutional:      General: She is irritable. She is not in acute distress.    Appearance: She is well-developed.  HENT:     Head: Normocephalic and atraumatic.     Right Ear: Tympanic membrane normal.     Left Ear: Tympanic membrane normal.  Eyes:     Pupils: Pupils are equal, round, and reactive to light.  Neck:     Thyroid: No thyromegaly.  Cardiovascular:     Rate and Rhythm: Normal rate and regular rhythm.     Heart sounds: Normal heart sounds. No murmur heard. Pulmonary:     Effort: Pulmonary  effort is normal. No respiratory distress.     Breath sounds: Normal breath sounds. No wheezing.  Abdominal:     General: Bowel sounds are normal. There is no distension.     Palpations: Abdomen is soft.     Tenderness: There is no abdominal tenderness.  Musculoskeletal:        General: No tenderness. Normal range of motion.     Cervical back: Normal range of motion and neck supple.  Skin:    General: Skin is warm and dry.  Neurological:     Mental Status: She is alert and oriented to person, place, and time.     Cranial Nerves: No cranial nerve deficit.     Deep Tendon Reflexes: Reflexes are normal and symmetric.  Psychiatric:        Mood and Affect: Mood is anxious and depressed. Affect is tearful.        Behavior: Behavior normal.        Thought Content: Thought content normal.        Judgment: Judgment normal.      BP 90/67   Pulse 83   Temp 98.1 F (36.7 C) (Temporal)   Ht 5' 1.5" (1.562 m)   Wt 130 lb (59 kg)   BMI 24.17 kg/m      Assessment & Plan:  Suzette A Surette comes in today with  chief complaint of Medical Management of Chronic Issues   Diagnosis and orders addressed:  1. GAD (generalized anxiety disorder) Will change buspar to 7.5 mg QID from 15 mg TID Stress management  - busPIRone (BUSPAR) 7.5 MG tablet; Take 1 tablet (7.5 mg total) by mouth 4 (four) times daily.  Dispense: 120 tablet; Refill: 2  2. Depression, major, single episode, moderate (HCC) - busPIRone (BUSPAR) 7.5 MG tablet; Take 1 tablet (7.5 mg total) by mouth 4 (four) times daily.  Dispense: 120 tablet; Refill: 2   3. Hypotension, unspecified hypotension type Force fluids   RTO in 6 months  Health Maintenance reviewed Diet and exercise encouraged  Evelina Dun, FNP

## 2020-08-30 NOTE — Patient Instructions (Signed)

## 2020-09-17 ENCOUNTER — Encounter: Payer: Self-pay | Admitting: Nurse Practitioner

## 2020-09-17 ENCOUNTER — Other Ambulatory Visit: Payer: Self-pay

## 2020-09-17 ENCOUNTER — Ambulatory Visit: Payer: Medicaid Other | Admitting: Nurse Practitioner

## 2020-09-17 VITALS — BP 107/77 | HR 82 | Temp 97.5°F | Ht 61.5 in | Wt 129.0 lb

## 2020-09-17 DIAGNOSIS — Z3200 Encounter for pregnancy test, result unknown: Secondary | ICD-10-CM | POA: Diagnosis not present

## 2020-09-17 DIAGNOSIS — R102 Pelvic and perineal pain: Secondary | ICD-10-CM

## 2020-09-17 NOTE — Assessment & Plan Note (Signed)
Patient has tubal ligation but continue to have monthly periods.  Last menstrual period was 5th.  July 5 through the 9th patient had only spotting but no complete flow.  Patient is reporting suprapubic pressure/mild pain.  Mild nausea few days prior.  Completed HCG pregnancy test.  Vaginal ultrasound ordered to rule out ectopic pregnancy, and CBC.

## 2020-09-17 NOTE — Patient Instructions (Signed)
Pelvic Pain, Female Pelvic pain is pain in your lower belly (abdomen), below your belly button and between your hips. The pain may start suddenly (be acute), keep coming back (be recurring), or last a long time (become chronic). Pelvic pain that lasts longer than 6 months is called chronic pelvic pain. There are many causes of pelvic pain. Sometimes the cause of pelvic pain is notknown. Follow these instructions at home:  Take over-the-counter and prescription medicines only as told by your doctor. Rest as told by your doctor. Do not have sex if it hurts. Keep a journal of your pelvic pain. Write down: When the pain started. Where the pain is located. What seems to make the pain better or worse, such as food or your period (menstrual cycle). Any symptoms you have along with the pain. Keep all follow-up visits as told by your doctor. This is important. Contact a doctor if: Medicine does not help your pain. Your pain comes back. You have new symptoms. You have unusual discharge or bleeding from your vagina. You have a fever or chills. You are having trouble pooping (constipation). You have blood in your pee (urine) or poop (stool). Your pee smells bad. You feel weak or light-headed. Get help right away if: You have sudden pain that is very bad. Your pain keeps getting worse. You have very bad pain and also have any of these symptoms: A fever. Feeling sick to your stomach (nausea). Throwing up (vomiting). Being very sweaty. You pass out (lose consciousness). Summary Pelvic pain is pain in your lower belly (abdomen), below your belly button and between your hips. There are many possible causes of pelvic pain. Keep a journal of your pelvic pain. This information is not intended to replace advice given to you by your health care provider. Make sure you discuss any questions you have with your healthcare provider. Document Revised: 08/05/2017 Document Reviewed: 08/05/2017 Elsevier  Patient Education  [place].

## 2020-09-17 NOTE — Assessment & Plan Note (Signed)
Completed urinalysis and hCG results pending.  Tylenol for pain.  Education provided to patient with printed handouts given follow-up with worsening unresolved symptoms.

## 2020-09-17 NOTE — Progress Notes (Signed)
Acute Office Visit  Subjective:    Patient ID: Vanessa Villanueva, female    DOB: Sep 23, 1990, 30 y.o.   MRN: [id number]  Chief Complaint  Patient presents with   Abdominal Pain    Pelvic Pain The patient's primary symptoms include missed menses and pelvic pain. The patient's pertinent negatives include no genital itching or genital odor. This is a new problem. The current episode started 1 to 4 weeks ago. The problem occurs intermittently. The problem has been unchanged. The pain is mild. Associated symptoms include nausea. Pertinent negatives include no chills, constipation, fever, flank pain or rash. Nothing aggravates the symptoms.    Past Medical History:  Diagnosis Date   Anemia     Past Surgical History:  Procedure Laterality Date   TUBAL LIGATION      Family History  Problem Relation Age of Onset   Diabetes Mother    COPD Mother    Mental illness Father        Bipolar, schizophenia    Social History   Socioeconomic History   Marital status: Married    Spouse name: Not on file   Number of children: Not on file   Years of education: Not on file   Highest education level: Not on file  Occupational History   Not on file  Tobacco Use   Smoking status: Never   Smokeless tobacco: Never  Vaping Use   Vaping Use: Every day  Substance and Sexual Activity   Alcohol use: No   Drug use: No   Sexual activity: Yes    Birth control/protection: Surgical  Other Topics Concern   Not on file  Social History Narrative   Not on file   Social Determinants of Health   Financial Resource Strain: Not on file  Food Insecurity: Not on file  Transportation Needs: Not on file  Physical Activity: Not on file  Stress: Not on file  Social Connections: Not on file  Intimate Partner Violence: Not on file    Outpatient Medications Prior to Visit  Medication Sig Dispense Refill   busPIRone (BUSPAR) 7.5 MG tablet Take 1 tablet (7.5 mg total) by mouth 4 (four) times daily.  120 tablet 2   escitalopram (LEXAPRO) 20 MG tablet Take 1 tablet (20 mg total) by mouth daily. 90 tablet 1   No facility-administered medications prior to visit.    Allergies  Allergen Reactions   Codeine Nausea And Vomiting    Review of Systems  Constitutional:  Negative for chills and fever.  Gastrointestinal:  Positive for nausea. Negative for constipation.  Genitourinary:  Positive for missed menses and pelvic pain. Negative for flank pain and vaginal pain.  Skin:  Negative for rash.      Objective:    Physical Exam Vitals and nursing note reviewed.  Constitutional:      Appearance: She is well-developed.  HENT:     Head: Normocephalic.  Eyes:     Conjunctiva/sclera: Conjunctivae normal.  Cardiovascular:     Rate and Rhythm: Normal rate and regular rhythm.  Pulmonary:     Effort: Pulmonary effort is normal.     Breath sounds: Normal breath sounds.  Abdominal:     General: Abdomen is flat.     Palpations: Abdomen is soft.     Tenderness: There is abdominal tenderness in the suprapubic area.  Neurological:     Mental Status: She is alert and oriented to person, place, and time.  Psychiatric:  Behavior: Behavior normal.    BP 107/77   Pulse 82   Temp (!) 97.5 F (36.4 C) (Temporal)   Ht 5' 1.5" (1.562 m)   Wt 129 lb (58.5 kg)   LMP 08/05/2020   SpO2 98%   BMI 23.98 kg/m  Wt Readings from Last 3 Encounters:  09/17/20 129 lb (58.5 kg)  08/30/20 130 lb (59 kg)  07/03/20 133 lb 6.4 oz (60.5 kg)    Health Maintenance Due  Topic Date Due   COVID-19 Vaccine (1) Never done   PAP SMEAR-Modifier  Never done    There are no preventive care reminders to display for this patient.   No results found for: TSH Lab Results  Component Value Date   WBC 5.9 05/07/2017   HGB 11.0 (L) 05/07/2017   HCT 35.5 (L) 05/07/2017   MCV 96.5 05/07/2017   PLT 223 05/07/2017   Lab Results  Component Value Date   NA 139 05/07/2017   K 3.3 (L) 05/07/2017   CO2 22  05/07/2017   GLUCOSE 82 05/07/2017   BUN 8 05/07/2017   CREATININE 0.48 05/07/2017   BILITOT 0.7 05/07/2017   ALKPHOS 50 05/07/2017   AST 19 05/07/2017   ALT 12 (L) 05/07/2017   PROT 7.6 05/07/2017   ALBUMIN 4.1 05/07/2017   CALCIUM 8.8 (L) 05/07/2017   ANIONGAP 11 05/07/2017       Assessment & Plan:   Problem List Items Addressed This Visit       Other   Pelvic pain - Primary    Completed urinalysis and hCG results pending.  Tylenol for pain.  Education provided to patient with printed handouts given follow-up with worsening unresolved symptoms.       Relevant Orders   CBC with Differential   Urine Microscopic   Possible pregnancy, not confirmed    Patient has tubal ligation but continue to have monthly periods.  Last menstrual period was 5th.  July 5 through the 9th patient had only spotting but no complete flow.  Patient is reporting suprapubic pressure/mild pain.  Mild nausea few days prior.  Completed HCG pregnancy test.  Vaginal ultrasound ordered to rule out ectopic pregnancy, and CBC.        Relevant Orders   hCG, serum, qualitative   CBC with Differential   US Pelvic Complete With Transvaginal     No orders of the defined types were placed in this encounter.    Ivy Lynn, NP

## 2020-09-18 ENCOUNTER — Encounter: Payer: Self-pay | Admitting: Nurse Practitioner

## 2020-09-18 ENCOUNTER — Ambulatory Visit (HOSPITAL_COMMUNITY)
Admission: RE | Admit: 2020-09-18 | Discharge: 2020-09-18 | Disposition: A | Discharge status: Home / Self Care | Payer: Medicaid Other | Source: Ambulatory Visit | Attending: Nurse Practitioner | Admitting: Nurse Practitioner

## 2020-09-18 DIAGNOSIS — Z3200 Encounter for pregnancy test, result unknown: Secondary | ICD-10-CM | POA: Insufficient documentation

## 2020-09-18 DIAGNOSIS — R102 Pelvic and perineal pain: Secondary | ICD-10-CM | POA: Diagnosis not present

## 2020-09-18 LAB — CBC WITH DIFFERENTIAL/PLATELET
Basophils Absolute: 0 10*3/uL (ref 0.0–0.2)
Basos: 1 %
EOS (ABSOLUTE): 0.1 10*3/uL (ref 0.0–0.4)
Eos: 2 %
Hematocrit: 33.2 % — ABNORMAL LOW (ref 34.0–46.6)
Hemoglobin: 10 g/dL — ABNORMAL LOW (ref 11.1–15.9)
Immature Grans (Abs): 0 10*3/uL (ref 0.0–0.1)
Immature Granulocytes: 0 %
Lymphocytes Absolute: 1.9 10*3/uL (ref 0.7–3.1)
Lymphs: 27 %
MCH: 27 pg (ref 26.6–33.0)
MCHC: 30.1 g/dL — ABNORMAL LOW (ref 31.5–35.7)
MCV: 90 fL (ref 79–97)
Monocytes Absolute: 0.5 10*3/uL (ref 0.1–0.9)
Monocytes: 8 %
Neutrophils Absolute: 4.4 10*3/uL (ref 1.4–7.0)
Neutrophils: 62 %
Platelets: 345 10*3/uL (ref 150–450)
RBC: 3.71 x10E6/uL — ABNORMAL LOW (ref 3.77–5.28)
RDW: 14 % (ref 11.7–15.4)
WBC: 7 10*3/uL (ref 3.4–10.8)

## 2020-09-18 LAB — HCG, SERUM, QUALITATIVE: hCG,Beta Subunit,Qual,Serum: NEGATIVE m[IU]/mL (ref <6)

## 2020-09-19 ENCOUNTER — Other Ambulatory Visit: Payer: Self-pay

## 2020-09-19 ENCOUNTER — Ambulatory Visit (INDEPENDENT_AMBULATORY_CARE_PROVIDER_SITE_OTHER): Payer: Medicaid Other

## 2020-09-19 ENCOUNTER — Other Ambulatory Visit: Payer: Medicaid Other

## 2020-09-19 ENCOUNTER — Other Ambulatory Visit: Payer: Self-pay | Admitting: Nurse Practitioner

## 2020-09-19 DIAGNOSIS — N200 Calculus of kidney: Secondary | ICD-10-CM | POA: Diagnosis not present

## 2020-09-19 DIAGNOSIS — R1030 Lower abdominal pain, unspecified: Secondary | ICD-10-CM

## 2020-09-19 DIAGNOSIS — R918 Other nonspecific abnormal finding of lung field: Secondary | ICD-10-CM | POA: Diagnosis not present

## 2020-09-21 ENCOUNTER — Emergency Department (HOSPITAL_COMMUNITY)
Admission: EM | Admit: 2020-09-21 | Discharge: 2020-09-21 | Disposition: A | Discharge status: Home / Self Care | Payer: Medicaid Other | Attending: Emergency Medicine | Admitting: Emergency Medicine

## 2020-09-21 ENCOUNTER — Emergency Department (HOSPITAL_COMMUNITY): Payer: Medicaid Other

## 2020-09-21 ENCOUNTER — Other Ambulatory Visit: Payer: Self-pay

## 2020-09-21 ENCOUNTER — Other Ambulatory Visit: Payer: Self-pay | Admitting: Nurse Practitioner

## 2020-09-21 ENCOUNTER — Encounter: Payer: Self-pay | Admitting: Nurse Practitioner

## 2020-09-21 ENCOUNTER — Encounter (HOSPITAL_COMMUNITY): Payer: Self-pay | Admitting: *Deleted

## 2020-09-21 DIAGNOSIS — S299XXA Unspecified injury of thorax, initial encounter: Secondary | ICD-10-CM | POA: Diagnosis present

## 2020-09-21 DIAGNOSIS — R102 Pelvic and perineal pain: Secondary | ICD-10-CM | POA: Insufficient documentation

## 2020-09-21 DIAGNOSIS — R109 Unspecified abdominal pain: Secondary | ICD-10-CM

## 2020-09-21 DIAGNOSIS — S39012A Strain of muscle, fascia and tendon of lower back, initial encounter: Secondary | ICD-10-CM | POA: Diagnosis not present

## 2020-09-21 DIAGNOSIS — R079 Chest pain, unspecified: Secondary | ICD-10-CM | POA: Diagnosis not present

## 2020-09-21 DIAGNOSIS — X58XXXA Exposure to other specified factors, initial encounter: Secondary | ICD-10-CM | POA: Diagnosis not present

## 2020-09-21 DIAGNOSIS — R0789 Other chest pain: Secondary | ICD-10-CM | POA: Diagnosis not present

## 2020-09-21 DIAGNOSIS — S29011A Strain of muscle and tendon of front wall of thorax, initial encounter: Secondary | ICD-10-CM | POA: Diagnosis not present

## 2020-09-21 LAB — CBC
HCT: 34.4 % — ABNORMAL LOW (ref 36.0–46.0)
Hemoglobin: 10.7 g/dL — ABNORMAL LOW (ref 12.0–15.0)
MCH: 28.4 pg (ref 26.0–34.0)
MCHC: 31.1 g/dL (ref 30.0–36.0)
MCV: 91.2 fL (ref 80.0–100.0)
Platelets: 359 10*3/uL (ref 150–400)
RBC: 3.77 MIL/uL — ABNORMAL LOW (ref 3.87–5.11)
RDW: 15.6 % — ABNORMAL HIGH (ref 11.5–15.5)
WBC: 6.5 10*3/uL (ref 4.0–10.5)
nRBC: 0 % (ref 0.0–0.2)

## 2020-09-21 LAB — BASIC METABOLIC PANEL
Anion gap: 10 (ref 5–15)
BUN: 9 mg/dL (ref 6–20)
CO2: 24 mmol/L (ref 22–32)
Calcium: 9.9 mg/dL (ref 8.9–10.3)
Chloride: 104 mmol/L (ref 98–111)
Creatinine, Ser: 0.63 mg/dL (ref 0.44–1.00)
GFR, Estimated: >60 mL/min (ref >60)
Glucose, Bld: 90 mg/dL (ref 70–99)
Potassium: 3.7 mmol/L (ref 3.5–5.1)
Sodium: 138 mmol/L (ref 135–145)

## 2020-09-21 LAB — HEPATIC FUNCTION PANEL
ALT: 9 U/L (ref 0–44)
AST: 18 U/L (ref 15–41)
Albumin: 4.6 g/dL (ref 3.5–5.0)
Alkaline Phosphatase: 48 U/L (ref 38–126)
Bilirubin, Direct: <0.1 mg/dL (ref 0.0–0.2)
Total Bilirubin: 0.9 mg/dL (ref 0.3–1.2)
Total Protein: 8.1 g/dL (ref 6.5–8.1)

## 2020-09-21 LAB — LIPASE, BLOOD: Lipase: 34 U/L (ref 11–51)

## 2020-09-21 LAB — POC URINE PREG, ED: Preg Test, Ur: NEGATIVE

## 2020-09-21 LAB — TROPONIN I (HIGH SENSITIVITY)
Troponin I (High Sensitivity): <2 ng/L (ref <18)
Troponin I (High Sensitivity): <2 ng/L (ref <18)

## 2020-09-21 MED ORDER — TRAMADOL HCL 50 MG PO TABS: 50.0000 mg | ORAL_TABLET | Freq: Four times a day (QID) | ORAL | 0 refills | Status: DC | PRN

## 2020-09-21 MED ORDER — TAMSULOSIN HCL 0.4 MG PO CAPS: 0.4000 mg | ORAL_CAPSULE | Freq: Every day | ORAL | 0 refills | Status: DC

## 2020-09-21 MED ORDER — ACETAMINOPHEN 500 MG PO TABS
500.0000 mg | ORAL_TABLET | Freq: Four times a day (QID) | ORAL | 0 refills | Status: DC | PRN
Start: 2020-09-21 — End: 2021-07-03

## 2020-09-21 NOTE — ED Provider Notes (Signed)
[facility] EMERGENCY DEPARTMENT Provider Note   CSN: [id number] Arrival date & time: 09/21/20  1049     History Chief Complaint  Patient presents with   Pelvic Pain    Vanessa Villanueva is a 30 y.o. female presenting for evaluation of persistent pelvic pain which is been present for the past week.  She describes a constant pressure in her lower pelvic region which radiates into her lower back.  She denies any vaginal discharge or risk for STDs.  She also denies any urinary complaints including dysuria or increased urinary frequency, she denies flank pain.  She was seen by her primary MD for this symptom 5 days ago and had lab test and a pelvic ultrasound which is a negative study.  She then was sent for a KUB which was completed and also negative.  Has concerns that the clips used for her tubal ligation are hard to discern stating when she originally had her BTL they were very bright and easy to find on a plain x-ray and the x-ray she saw this week looks very different.  Essentially she is worried that her clips have migrated and are now causing her pelvic pain.  She also endorses chest pain for a week.  She describes a heavy,  pressure like pain in her lower mid sternal region.  It hurts worse with palpation and with movement, especially when she raises her arms.  She does report lifting heavy objects at work and possibly pulled some muscles.  She has used heat to the site which does help relieve her symptoms.  No history of ACS.  She denies acid reflux.  She also denies upper abdominal pain, shortness of breath, cough, fevers or chills.  No palpitations nausea or vomiting.  No diaphoresis.  The history is provided by the patient.      Past Medical History:  Diagnosis Date   Anemia     Patient Active Problem List   Diagnosis Date Noted   Flank pain 09/21/2020   Possible pregnancy, not confirmed 09/17/2020   GAD (generalized anxiety disorder) 08/30/2020   Depression, major, single  episode, moderate ([place]) 08/30/2020   Pelvic pain 08/19/2019    Past Surgical History:  Procedure Laterality Date   TUBAL LIGATION       OB History     Gravida  2   Para      Term      Preterm      AB      Living         SAB      IAB      Ectopic      Multiple      Live Births              Family History  Problem Relation Age of Onset   Diabetes Mother    COPD Mother    Mental illness Father        Bipolar, schizophenia    Social History   Tobacco Use   Smoking status: Never   Smokeless tobacco: Never  Vaping Use   Vaping Use: Every day  Substance Use Topics   Alcohol use: No   Drug use: No    Home Medications Prior to Admission medications   Medication Sig Start Date End Date Taking? Authorizing Provider  traMADol (ULTRAM) 50 MG tablet Take 1 tablet (50 mg total) by mouth every 6 (six) hours as needed. 09/21/20  Yes Brieonna Crutcher, Almyra Free, PA-C  acetaminophen (TYLENOL) 500  MG tablet Take 1 tablet (500 mg total) by mouth every 6 (six) hours as needed. 09/21/20   Ivy Lynn, NP  busPIRone (BUSPAR) 7.5 MG tablet Take 1 tablet (7.5 mg total) by mouth 4 (four) times daily. 08/30/20   Evelina Dun A, FNP  escitalopram (LEXAPRO) 20 MG tablet Take 1 tablet (20 mg total) by mouth daily. 07/03/20   Sharion Balloon, FNP  tamsulosin (FLOMAX) 0.4 MG CAPS capsule Take 1 capsule (0.4 mg total) by mouth daily. Discard after passing stone 09/21/20   Ivy Lynn, NP    Allergies    Codeine  Review of Systems   Review of Systems  Constitutional:  Negative for chills, diaphoresis and fever.  HENT: Negative.    Eyes: Negative.   Respiratory:  Positive for chest tightness. Negative for shortness of breath and wheezing.   Cardiovascular:  Positive for chest pain. Negative for palpitations and leg swelling.  Gastrointestinal:  Negative for abdominal pain, nausea and vomiting.  Genitourinary:  Positive for pelvic pain. Negative for dysuria and flank pain.   Musculoskeletal:  Positive for back pain. Negative for arthralgias, joint swelling and neck pain.  Skin: Negative.  Negative for rash and wound.  Neurological: Negative.   Psychiatric/Behavioral: Negative.    All other systems reviewed and are negative.  Physical Exam Updated Vital Signs BP 92/66   Pulse 72   Temp 98.2 F (36.8 C)   Resp 14   Ht '5\' 1"'$  (1.549 m)   Wt 58.1 kg   SpO2 98%   BMI 24.19 kg/m   Physical Exam Vitals and nursing note reviewed.  Constitutional:      Appearance: She is well-developed.  HENT:     Head: Normocephalic and atraumatic.  Eyes:     Conjunctiva/sclera: Conjunctivae normal.  Cardiovascular:     Rate and Rhythm: Normal rate and regular rhythm.     Heart sounds: Normal heart sounds.  Pulmonary:     Effort: Pulmonary effort is normal.     Breath sounds: Normal breath sounds. No wheezing.  Chest:     Chest wall: Tenderness present. No deformity, swelling, crepitus or edema.       Comments: Reproducible pain to palpation lower sternal border. Abdominal:     General: Bowel sounds are normal.     Palpations: Abdomen is soft.     Tenderness: There is abdominal tenderness in the suprapubic area. There is no right CVA tenderness, left CVA tenderness, guarding or rebound.  Musculoskeletal:        General: Normal range of motion.     Cervical back: Normal range of motion.  Skin:    General: Skin is warm and dry.  Neurological:     Mental Status: She is alert.    ED Results / Procedures / Treatments   Labs (all labs ordered are listed, but only abnormal results are displayed) Labs Reviewed  CBC - Abnormal; Notable for the following components:      Result Value   RBC 3.77 (*)    Hemoglobin 10.7 (*)    HCT 34.4 (*)    RDW 15.6 (*)    All other components within normal limits  BASIC METABOLIC PANEL  HEPATIC FUNCTION PANEL  LIPASE, BLOOD  POC URINE PREG, ED  TROPONIN I (HIGH SENSITIVITY)  TROPONIN I (HIGH SENSITIVITY)    EKG EKG  Interpretation  Date/Time:  Friday September 21 2020 11:14:17 EDT Ventricular Rate:  118 PR Interval:  158 QRS Duration: 70 QT Interval:  294 QTC Calculation: 412 R Axis:   84 Text Interpretation: Sinus tachycardia Biatrial enlargement Abnormal ECG Since last tracing rate faster Otherwise no significant change Confirmed by Daleen Bo [phone]) on 09/21/2020 11:24:37 AM  Radiology DG Chest 2 View  Result Date: 09/21/2020 CLINICAL DATA:  Chest pain EXAM: CHEST - 2 VIEW COMPARISON:  None. FINDINGS: The lungs are clear without focal pneumonia, edema, pneumothorax or pleural effusion. The cardiopericardial silhouette is within normal limits for size. The visualized bony structures of the thorax show no acute abnormality. IMPRESSION: No active cardiopulmonary disease. Electronically Signed   By: Misty Stanley M.D.   On: 09/21/2020 12:14    Procedures Procedures   Medications Ordered in ED Medications - No data to display  ED Course  I have reviewed the triage vital signs and the nursing notes.  Pertinent labs & imaging results that were available during my care of the patient were reviewed by me and considered in my medical decision making (see chart for details).    MDM Rules/Calculators/A&P                           Imaging and labs reviewed and discussed with patient.  Additionally outside imaging by patient's primary provider including her pelvic ultrasound and the KUB were reviewed and discussed with the patient as well.  Her troponins and EKG are reassuring, initially she was tachycardic upon presentation but the remainder of her visit she had a normal pulse rate.  She has no shortness of breath and her chest pain is reproducible, I suspect that this is chest wall strain.  She was encouraged to continue with her heat therapy, adding ibuprofen and activity as tolerated.  Her KUB completed the day before arrival suggest there are ligation clips in her pelvis.  Patient is insistent that  they were much more easily identified in 2017 when she had the procedure completed.  I explained to the patient that it is possible for clips to migrate but it is a rare event that it causes any significant problems and does not should not increase her risk for pregnancy, assuming the clips remained in place for least 6 weeks after her procedure, there should be enough scar tissue at the site for effective birth control..  Labs are stable, exam is benign with no acute abdominal findings.  She was encouraged to follow-up with her gynecologist for further evaluation if her symptoms persist. Final Clinical Impression(s) / ED Diagnoses Final diagnoses:  Pelvic pain in female  Muscle strain of chest wall, initial encounter    Rx / DC Orders ED Discharge Orders          Ordered    traMADol (ULTRAM) 50 MG tablet  Every 6 hours PRN        09/21/20 1419             Evalee Jefferson, PA-C 09/22/20 1845    Daleen Bo, MD 09/23/20 [phone]

## 2020-09-21 NOTE — Discharge Instructions (Addendum)
Your lab test, EKG, x-ray and exam are reassuring today and there are no signs of an emergent problem today.  You may continue taking your home ibuprofen for your symptoms, but stop this medicine you develop any acid reflux like symptoms.    You have been prescribed a mild pain medication to help you with your symptoms.  I do recommend following up with your gynecologist for further management of your symptoms if they persist.

## 2020-09-21 NOTE — ED Triage Notes (Signed)
Patient has multiple complaints , states she was seen for pelvic issues this week and has had ultrasound, states she has had chest pain for over a week

## 2020-09-24 ENCOUNTER — Telehealth: Payer: Self-pay

## 2020-09-24 NOTE — Telephone Encounter (Signed)
Transition Care Management Follow-up Telephone Call Date of discharge and from where: 09/21/2020-[facility] ED  How have you been since you were released from the hospital? Patient stated she is doing fine.  Any questions or concerns? No  Items Reviewed: Did the pt receive and understand the discharge instructions provided? Yes  Medications obtained and verified? Yes  Other? No  Any new allergies since your discharge? No  Dietary orders reviewed? N/A Do you have support at home? Yes   Home Care and Equipment/Supplies: Were home health services ordered? not applicable If so, what is the name of the agency? N/A  Has the agency set up a time to come to the patient's home? not applicable Were any new equipment or medical supplies ordered?  No What is the name of the medical supply agency? N/A Were you able to get the supplies/equipment? not applicable Do you have any questions related to the use of the equipment or supplies? No  Functional Questionnaire: (I = Independent and D = Dependent) ADLs: I  Bathing/Dressing- I  Meal Prep- I  Eating- I  Maintaining continence- I  Transferring/Ambulation- I  Managing Meds- I  Follow up appointments reviewed:  PCP Hospital f/u appt confirmed? No  Specialist Hospital f/u appt confirmed? No   Are transportation arrangements needed? No  If their condition worsens, is the pt aware to call PCP or go to the Emergency Dept.? Yes Was the patient provided with contact information for the PCP's office or ED? Yes Was to pt encouraged to call back with questions or concerns? Yes

## 2021-01-03 ENCOUNTER — Encounter: Payer: Self-pay | Admitting: Family Medicine

## 2021-01-03 ENCOUNTER — Other Ambulatory Visit: Payer: Self-pay | Admitting: Family Medicine

## 2021-01-03 ENCOUNTER — Ambulatory Visit (HOSPITAL_COMMUNITY)
Admission: RE | Admit: 2021-01-03 | Discharge: 2021-01-03 | Disposition: A | Discharge status: Home / Self Care | Payer: Medicaid Other | Source: Ambulatory Visit | Attending: Family Medicine | Admitting: Family Medicine

## 2021-01-03 ENCOUNTER — Other Ambulatory Visit: Payer: Self-pay

## 2021-01-03 ENCOUNTER — Ambulatory Visit: Payer: Medicaid Other | Admitting: Family Medicine

## 2021-01-03 VITALS — BP 103/74 | HR 107 | Temp 97.8°F | Ht 61.0 in | Wt 127.8 lb

## 2021-01-03 DIAGNOSIS — R1011 Right upper quadrant pain: Secondary | ICD-10-CM

## 2021-01-03 DIAGNOSIS — R109 Unspecified abdominal pain: Secondary | ICD-10-CM | POA: Diagnosis not present

## 2021-01-03 DIAGNOSIS — Z87442 Personal history of urinary calculi: Secondary | ICD-10-CM

## 2021-01-03 MED ORDER — KETOROLAC TROMETHAMINE 60 MG/2ML IM SOLN
60.0000 mg | Freq: Once | INTRAMUSCULAR | Status: AC
Start: 2021-01-03 — End: 2021-01-03
  Administered 2021-01-03: 60 mg via INTRAMUSCULAR

## 2021-01-03 NOTE — Progress Notes (Signed)
Assessment & Plan:  1. RUQ abdominal pain - will rule out gall bladder as source of abdominal pain - education provided on gallbladder eating plan to have if needed - US Abdomen Limited RUQ (LIVER/GB); Future - ketorolac (TORADOL) injection 60 mg  2-3. Right flank pain/History of Kidney Stones - KUB in July showed right kidney stone, patient is not concerned about this at this time - education provided on kidney stones   Follow up plan: Return if symptoms worsen or fail to improve.  Lucile Crater, NP Student  I personally was present during the history, physical exam, and medical decision-making activities of this service and have verified that the service and findings are accurately documented in the nurse practitioner student's note.  Hendricks Limes, MSN, APRN, FNP-C [facility]   Subjective:   Patient ID: Vanessa Villanueva, female    DOB: 07-21-1990, 30 y.o.   MRN: [id number]  HPI: Vanessa Villanueva is a 30 y.o. female presenting on 01/03/2021 for Flank Pain (Right sided flank pain with upper back pain and chest pain.  Started x 2 month ago but stopped and started back x 1 week. )   She has had ongoing abdominal pain that worsens when she eats. She was evaluated in the ED in July this year for a similar complaint and her KUB was negative with exception of a right kidney stone. Today she complains of severe right flank pain that is tender to touch. She states the pain is worse when she eats and that "it's not a kidney stone I've had one before." The abdominal pain originates in her mid epigastric area and radiates to the left chest wall and upper back. She states her mom and sister both had chronic cholecystitis resulting in removal of the gallbladder. She states the pain is so severe that she is not eating much of anything because she cant tolerate the pain. She states NSAIDs and tylenol aren't effective so she isn't taking them. She was given a short course  of Tramadol in the ED that she states did not help her pain either.    ROS: Negative unless specifically indicated above in HPI.   Relevant past medical history reviewed and updated as indicated.   Allergies and medications reviewed and updated.   Current Outpatient Medications:    acetaminophen (TYLENOL) 500 MG tablet, Take 1 tablet (500 mg total) by mouth every 6 (six) hours as needed., Disp: 30 tablet, Rfl: 0   busPIRone (BUSPAR) 7.5 MG tablet, Take 1 tablet (7.5 mg total) by mouth 4 (four) times daily., Disp: 120 tablet, Rfl: 2   escitalopram (LEXAPRO) 20 MG tablet, Take 1 tablet (20 mg total) by mouth daily., Disp: 90 tablet, Rfl: 1  Allergies  Allergen Reactions   Codeine Nausea And Vomiting    Objective:   BP 103/74   Pulse (!) 107   Temp 97.8 F (36.6 C) (Temporal)   Ht 5\' 1"  (1.549 m)   Wt 58 kg   SpO2 100%   BMI 24.15 kg/m    Physical Exam Vitals reviewed.  Constitutional:      General: She is not in acute distress.    Appearance: Normal appearance. She is normal weight. She is not ill-appearing, toxic-appearing or diaphoretic.  HENT:     Head: Normocephalic and atraumatic.     Nose: Nose normal.     Mouth/Throat:     Mouth: Mucous membranes are moist.     Pharynx: Oropharynx is clear.  Eyes:     Extraocular Movements: Extraocular movements intact.     Conjunctiva/sclera: Conjunctivae normal.     Pupils: Pupils are equal, round, and reactive to light.  Cardiovascular:     Rate and Rhythm: Normal rate and regular rhythm.     Heart sounds: Normal heart sounds.  Pulmonary:     Effort: Pulmonary effort is normal. No respiratory distress.  Chest:     Chest wall: Tenderness (reproducable to palpation) present.  Abdominal:     General: Abdomen is flat. Bowel sounds are normal. There is no distension.     Palpations: Abdomen is soft. There is no mass.     Tenderness: There is abdominal tenderness. There is right CVA tenderness.  Musculoskeletal:         General: Normal range of motion.     Cervical back: Normal range of motion.  Skin:    General: Skin is warm and dry.  Neurological:     General: No focal deficit present.     Mental Status: She is alert and oriented to person, place, and time.     Motor: No weakness.     Gait: Gait normal.  Psychiatric:        Mood and Affect: Mood normal.        Behavior: Behavior normal.        Thought Content: Thought content normal.        Judgment: Judgment normal.

## 2021-01-04 ENCOUNTER — Encounter (INDEPENDENT_AMBULATORY_CARE_PROVIDER_SITE_OTHER): Payer: Self-pay | Admitting: *Deleted

## 2021-01-07 ENCOUNTER — Encounter (INDEPENDENT_AMBULATORY_CARE_PROVIDER_SITE_OTHER): Payer: Self-pay

## 2021-01-07 ENCOUNTER — Ambulatory Visit (INDEPENDENT_AMBULATORY_CARE_PROVIDER_SITE_OTHER): Payer: Medicaid Other | Admitting: Gastroenterology

## 2021-01-07 ENCOUNTER — Encounter (INDEPENDENT_AMBULATORY_CARE_PROVIDER_SITE_OTHER): Payer: Self-pay | Admitting: Gastroenterology

## 2021-01-07 ENCOUNTER — Other Ambulatory Visit: Payer: Self-pay

## 2021-01-07 ENCOUNTER — Other Ambulatory Visit (INDEPENDENT_AMBULATORY_CARE_PROVIDER_SITE_OTHER): Payer: Self-pay

## 2021-01-07 DIAGNOSIS — R079 Chest pain, unspecified: Secondary | ICD-10-CM | POA: Diagnosis not present

## 2021-01-07 DIAGNOSIS — D649 Anemia, unspecified: Secondary | ICD-10-CM

## 2021-01-07 DIAGNOSIS — K5909 Other constipation: Secondary | ICD-10-CM

## 2021-01-07 DIAGNOSIS — Z01812 Encounter for preprocedural laboratory examination: Secondary | ICD-10-CM

## 2021-01-07 MED ORDER — NAPROXEN 500 MG PO TBEC: 500.0000 mg | DELAYED_RELEASE_TABLET | Freq: Two times a day (BID) | ORAL | 0 refills | Status: AC

## 2021-01-07 NOTE — Patient Instructions (Addendum)
Schedule EGD Start naproxen 500 mg twice a day for 4 weeks Start taking Miralax 1 capful every day for one week. If bowel movements do not improve, increase to 1 capful every 12 hours. If after two weeks there is no improvement, increase to 1 capful every 8 hours Perform blood workup

## 2021-01-07 NOTE — Progress Notes (Signed)
Vanessa Villanueva, M.D. Gastroenterology & Hepatology [facility] [address] [place], [place] [address] Primary Care Physician: Vanessa Villanueva, Vanessa Villanueva  Referring MD: PCP  Chief Complaint: Chest and upper abdominal pain  History of Present Illness: Vanessa Villanueva is a 30 y.o. female with past medical history of generalized anxiety disorder, depression and chronic anemia, who presents for evaluation of chest and upper abdominal pain.  Patient reports that for the last 4 months she presented and initial episode of pain in her retrosternal area. She describes it as a pressure. She reports never having a similar symptoms in the past. Went to [facility] on 09/21/2020 for further evaluation. Had EKG that showed sinus tachycardia of 118 bpm and KUB without acute alterations. Most recent blood work-up on 09/21/2020 showed normal CMP with AST of 18, ALT of 9, total bilirubin 0.9, electrolytes and renal function, CBC with hemoglobin of 10.7 MCV of 91, although cell count of 6.5 and platelets of 359. Was discharged on tramadol but she denied having complete relief of her pain afterwards. She is now having pain on a daily basis, but is concerned as pain is now present in her right upper quadrant and goes to her back.  She is very concerned that this could be related to her gallbladder as her mother and sister had gallbladder issues that required cholecystectomy in the past. She has noticed that anything that has fat/diary or greasy food triggers the symptoms. She states is concerned as her pain is radiating to the RUQ and her back now.  In fact she states that her pain is fixated in her mid thoracic area in her back.  She has tried chewable Tums, Zantac and OTC omeprazole for management of pain. States that even while taking these medications she has not felt any improvement.  She does not feel that they are helpful as she  does not have any heartburn episodes.  Denies any dysphagia or odynophagia.  States that for the last 7 years she has a bowel once every month.  She has not had any abdominal pain due to her constipation in the past and does not usually feel bloated.  The patient denies having any nausea, vomiting, fever, chills, hematochezia, melena, hematemesis, jaundice, pruritus or weight loss.  The patient was ordered right upper quadrant ultrasound by her PCP on 01/03/2021 which showed a CBD of 2.4 mm and normal liver without any alterations in the gallbladder.    Last QIH:KVQQV Last Colonoscopy:never  FHx: neg for any gastrointestinal/liver disease, no malignancies Social: vapes every day, neg alcohol or illicit drug use Surgical:  tubal ligation  Past Medical History: Past Medical History:  Diagnosis Date   Anemia     Past Surgical History: Past Surgical History:  Procedure Laterality Date   TUBAL LIGATION      Family History: Family History  Problem Relation Age of Onset   Diabetes Mother    COPD Mother    Mental illness Father        Bipolar, schizophenia    Social History: Social History   Tobacco Use  Smoking Status Never  Smokeless Tobacco Never   Social History   Substance and Sexual Activity  Alcohol Use No   Social History   Substance and Sexual Activity  Drug Use No    Allergies: Allergies  Allergen Reactions   Codeine Nausea And Vomiting    Medications: Current Outpatient Medications  Medication  Sig Dispense Refill   acetaminophen (TYLENOL) 500 MG tablet Take 1 tablet (500 mg total) by mouth every 6 (six) hours as needed. 30 tablet 0   busPIRone (BUSPAR) 7.5 MG tablet Take 1 tablet (7.5 mg total) by mouth 4 (four) times daily. 120 tablet 2   escitalopram (LEXAPRO) 20 MG tablet Take 1 tablet (20 mg total) by mouth daily. 90 tablet 1   No current facility-administered medications for this visit.    Review of Systems: GENERAL: negative for malaise,  night sweats HEENT: No changes in hearing or vision, no nose bleeds or other nasal problems. NECK: Negative for lumps, goiter, pain and significant neck swelling RESPIRATORY: Negative for cough, wheezing CARDIOVASCULAR: Negative for chest pain, leg swelling, palpitations, orthopnea GI: SEE HPI MUSCULOSKELETAL: Negative for joint pain or swelling, back pain, and muscle pain. SKIN: Negative for lesions, rash PSYCH: Negative for sleep disturbance, mood disorder and recent psychosocial stressors. HEMATOLOGY Negative for prolonged bleeding, bruising easily, and swollen nodes. ENDOCRINE: Negative for cold or heat intolerance, polyuria, polydipsia and goiter. NEURO: negative for tremor, gait imbalance, syncope and seizures. The remainder of the review of systems is noncontributory.   Physical Exam: BP 102/60 (BP Location: Left Arm, Patient Position: Sitting, Cuff Size: Small)   Pulse 86   Temp 98.3 F (36.8 C) (Oral)   Ht 5\' 1"  (1.549 m)   Wt 127 lb 1.6 oz (57.7 kg)   BMI 24.02 kg/m  GENERAL: The patient is AO x3, in no acute distress. HEENT: Head is normocephalic and atraumatic. EOMI are intact. Mouth is well hydrated and without lesions. NECK: Supple. No masses LUNGS: Clear to auscultation. No presence of rhonchi/wheezing/rales. Adequate chest expansion HEART: RRR, normal s1 and s2. ABDOMEN: tender to palpation in the costal ridge on right and left side, no guarding, no peritoneal signs, and nondistended. BS +. No masses. EXTREMITIES: Without any cyanosis, clubbing, rash, lesions or edema. NEUROLOGIC: AOx3, no focal motor deficit. SKIN: no jaundice, no rashes   Imaging/Labs: as above  I personally reviewed and interpreted the available labs, imaging and endoscopic files.  Impression and Plan: Vanessa Villanueva is a 30 y.o. female with past medical history of generalized anxiety disorder, depression and chronic anemia, who presents for evaluation of chest and upper abdominal  pain.  The patient has presented.  Episode of pain in the past 4 months, which specifically have been present in the rib cage, especially in the sternum and the costal ridge.  Given the physical exam findings, it is very possible her symptoms are related to costochondritis, although there is a possibility her symptoms are functional as her recent ultrasound and blood work-up have been unremarkable.  We will evaluate for other organic etiologies with an esophagogastroduodenospy, repeat CBC/CMP and celiac testing; ultimately may require a CT of the chest, abdomen and pelvis to provide reassurance with patient, but will also give a 4-week course of naproxen.  The patient understood and agreed.  As she is presenting for regular bowel movements, I do believe that she will benefit from increasing her bowel movement frequency by taking MiraLAX.  This could have a condition with her possible functional symptoms.  Finally, we will characterize her anemia further with iron testing.  - Schedule EGD - Start naproxen 500 mg twice a day for 4 weeks - Start taking Miralax 1 capful every day for one week. If bowel movements do not improve, increase to 1 capful every 12 hours. If after two weeks there is no improvement, increase  to 1 capful every 8 hours -Check CBC, CMP, celiac serology, iron stores - May consider performing CT chest, abdomen and pelvis if negative finding him persistent symptoms  All questions were answered.      Vanessa Peppers, MD Gastroenterology and Hepatology [facility]

## 2021-01-08 ENCOUNTER — Other Ambulatory Visit (INDEPENDENT_AMBULATORY_CARE_PROVIDER_SITE_OTHER): Payer: Self-pay | Admitting: Gastroenterology

## 2021-01-08 ENCOUNTER — Encounter (INDEPENDENT_AMBULATORY_CARE_PROVIDER_SITE_OTHER): Payer: Self-pay

## 2021-01-08 DIAGNOSIS — D509 Iron deficiency anemia, unspecified: Secondary | ICD-10-CM

## 2021-01-08 LAB — CELIAC DISEASE PANEL
(tTG) Ab, IgA: <1 U/mL
(tTG) Ab, IgG: <1 U/mL
Gliadin IgA: <1 U/mL
Gliadin IgG: <1 U/mL
Immunoglobulin A: 169 mg/dL (ref 47–310)

## 2021-01-08 LAB — COMPREHENSIVE METABOLIC PANEL
AG Ratio: 1.5 (calc) (ref 1.0–2.5)
ALT: 5 U/L — ABNORMAL LOW (ref 6–29)
AST: 13 U/L (ref 10–30)
Albumin: 4.6 g/dL (ref 3.6–5.1)
Alkaline phosphatase (APISO): 48 U/L (ref 31–125)
BUN/Creatinine Ratio: 7 (calc) (ref 6–22)
BUN: 5 mg/dL — ABNORMAL LOW (ref 7–25)
CO2: 24 mmol/L (ref 20–32)
Calcium: 9.5 mg/dL (ref 8.6–10.2)
Chloride: 105 mmol/L (ref 98–110)
Creat: 0.72 mg/dL (ref 0.50–0.97)
Globulin: 3 g/dL (calc) (ref 1.9–3.7)
Glucose, Bld: 86 mg/dL (ref 65–99)
Potassium: 4.4 mmol/L (ref 3.5–5.3)
Sodium: 139 mmol/L (ref 135–146)
Total Bilirubin: 0.9 mg/dL (ref 0.2–1.2)
Total Protein: 7.6 g/dL (ref 6.1–8.1)

## 2021-01-08 LAB — CBC WITH DIFFERENTIAL/PLATELET
Absolute Monocytes: 405 cells/uL (ref 200–950)
Basophils Absolute: 40 cells/uL (ref 0–200)
Basophils Relative: 0.7 %
Eosinophils Absolute: 91 cells/uL (ref 15–500)
Eosinophils Relative: 1.6 %
HCT: 34.2 % — ABNORMAL LOW (ref 35.0–45.0)
Hemoglobin: 10.8 g/dL — ABNORMAL LOW (ref 11.7–15.5)
Lymphs Abs: 1391 cells/uL (ref 850–3900)
MCH: 28 pg (ref 27.0–33.0)
MCHC: 31.6 g/dL — ABNORMAL LOW (ref 32.0–36.0)
MCV: 88.6 fL (ref 80.0–100.0)
MPV: 12.2 fL (ref 7.5–12.5)
Monocytes Relative: 7.1 %
Neutro Abs: 3773 cells/uL (ref 1500–7800)
Neutrophils Relative %: 66.2 %
Platelets: 362 10*3/uL (ref 140–400)
RBC: 3.86 10*6/uL (ref 3.80–5.10)
RDW: 13.9 % (ref 11.0–15.0)
Total Lymphocyte: 24.4 %
WBC: 5.7 10*3/uL (ref 3.8–10.8)

## 2021-01-08 LAB — IRON, TOTAL/TOTAL IRON BINDING CAP
%SAT: 12 % (calc) — ABNORMAL LOW (ref 16–45)
Iron: 45 ug/dL (ref 40–190)
TIBC: 385 mcg/dL (calc) (ref 250–450)

## 2021-01-08 LAB — FERRITIN: Ferritin: 6 ng/mL — ABNORMAL LOW (ref 16–154)

## 2021-01-08 MED ORDER — FERROUS SULFATE 325 (65 FE) MG PO TABS: 325.0000 mg | ORAL_TABLET | Freq: Every day | ORAL | 3 refills | Status: AC

## 2021-01-14 ENCOUNTER — Encounter (INDEPENDENT_AMBULATORY_CARE_PROVIDER_SITE_OTHER): Payer: Self-pay

## 2021-01-16 ENCOUNTER — Encounter: Payer: Self-pay | Admitting: Family

## 2021-01-16 ENCOUNTER — Ambulatory Visit: Payer: Medicaid Other | Admitting: Family

## 2021-01-16 ENCOUNTER — Other Ambulatory Visit: Payer: Self-pay

## 2021-01-16 VITALS — BP 102/64 | HR 86 | Temp 98.0°F | Ht 61.0 in | Wt 129.4 lb

## 2021-01-16 DIAGNOSIS — Z91018 Allergy to other foods: Secondary | ICD-10-CM | POA: Diagnosis not present

## 2021-01-16 DIAGNOSIS — T7840XA Allergy, unspecified, initial encounter: Secondary | ICD-10-CM | POA: Diagnosis not present

## 2021-01-16 MED ORDER — EPINEPHRINE 0.3 MG/0.3ML IJ SOAJ: 0.3000 mg | INTRAMUSCULAR | 1 refills | Status: AC | PRN

## 2021-01-16 NOTE — Progress Notes (Signed)
Subjective:    Patient ID: Vanessa Villanueva, female    DOB: 1990/07/02, 30 y.o.   MRN: [id number]  Chief Complaint  Patient presents with   Allergic Reaction    To dairy wants labs to check    PT presents to the office today to discuss allergy reaction to dairy allergy (milk, cheese, ice cream and milkshakes) that started 5 years ago mildly. However, since June she has had more adverse reactions that include chest pain, back pain, hard to swallow, and feeling like her tongue is swollen.   Allergic Reaction This is a recurrent problem. The current episode started more than 1 week ago. The problem has been waxing and waning since onset. The patient was exposed to food. Associated symptoms include chest pain, chest pressure and trouble swallowing. Pertinent negatives include no coughing, Villanueva itching, Villanueva redness, itching, rash or wheezing. (nausea) There is no swelling present. Past treatments include nothing. The treatment provided no relief.     Review of Systems  HENT:  Positive for trouble swallowing.   Eyes:  Negative for redness and itching.  Respiratory:  Negative for cough and wheezing.   Cardiovascular:  Positive for chest pain.  Skin:  Negative for itching and rash.  All other systems reviewed and are negative.     Objective:   Physical Exam Vitals reviewed.  Constitutional:      General: She is not in acute distress.    Appearance: She is well-developed.  HENT:     Head: Normocephalic and atraumatic.     Right Ear: Tympanic membrane normal.     Left Ear: Tympanic membrane normal.  Eyes:     Pupils: Pupils are equal, round, and reactive to light.  Neck:     Thyroid: No thyromegaly.  Cardiovascular:     Rate and Rhythm: Normal rate and regular rhythm.     Heart sounds: Normal heart sounds. No murmur heard. Pulmonary:     Effort: Pulmonary effort is normal. No respiratory distress.     Breath sounds: Normal breath sounds. No wheezing.  Abdominal:     General:  Bowel sounds are normal. There is no distension.     Palpations: Abdomen is soft.     Tenderness: There is no abdominal tenderness.  Musculoskeletal:        General: No tenderness. Normal range of motion.     Cervical back: Normal range of motion and neck supple.  Skin:    General: Skin is warm and dry.  Neurological:     Mental Status: She is alert and oriented to person, place, and time.     Cranial Nerves: No cranial nerve deficit.     Deep Tendon Reflexes: Reflexes are normal and symmetric.  Psychiatric:        Behavior: Behavior normal.        Thought Content: Thought content normal.        Judgment: Judgment normal.     BP 102/64   Pulse 86   Temp 98 F (36.7 C) (Temporal)   Ht 5\' 1"  (1.549 m)   Wt 129 lb 6.4 oz (58.7 kg)   BMI 24.45 kg/m       Assessment & Plan:  Kaislee A Neyman comes in today with chief complaint of Allergic Reaction (To dairy wants labs to check )   Diagnosis and orders addressed:  1. Allergic reaction, initial encounter - Allergen Profile, Food-Milk - Alpha-Gal Panel - EPINEPHrine 0.3 mg/0.3 mL IJ SOAJ injection; Inject 0.3  mg into the muscle as needed for anaphylaxis.  Dispense: 1 each; Refill: 1  2. Food allergy - Allergen Profile, Food-Milk - Alpha-Gal Panel - EPINEPHrine 0.3 mg/0.3 mL IJ SOAJ injection; Inject 0.3 mg into the muscle as needed for anaphylaxis.  Dispense: 1 each; Refill: 1  Avoid dairy Epi pen given Labs pending  Take Benadryl as needed    Evelina Dun, FNP

## 2021-01-16 NOTE — Patient Instructions (Addendum)
Food Allergy ?A food allergy is an abnormal reaction to a food (food allergen) by the body's defense system (immune system). Foods that commonly cause allergies include: ?Milk. ?Seafood. ?Eggs. ?Peanuts. ?Wheat. ?Soy. ?Tree nuts such as pecans, walnuts, and cashews. ?What are the causes? ?Food allergies happen when the immune system sees a food as harmful and releases proteins (antibodies) to fight it. ?What are the signs or symptoms? ?Symptoms may be mild or severe. They usually start minutes after eating the food, but they can occur even a few hours later. In people with a severe allergy, symptoms can start within seconds. ?Mild symptoms of this condition include: ?Congested nose. ?Tingling in the mouth. ?An itchy, red rash. ?Vomiting. ?Diarrhea. ?In people with a severe allergy, a life-threatening reaction can occur called anaphylaxis. Get help right away if you have symptoms of anaphylaxis, such as: ?Feeling warm in the face (flushed). This may include redness. ?Itchy, red, swollen areas of skin (hives). ?Swelling of the eyes, lips, face, mouth, tongue, or throat. ?Difficulty breathing, speaking, or swallowing. ?Noisy breathing, high-pitched whistling sounds when you breathe, most often when you breathe out (wheezing). ?Dizziness, light-headedness, or fainting. ?Pain or cramping in the abdomen. ?How is this diagnosed? ?This condition may be diagnosed based on: ?A physical exam. ?Your medical history. ?Skin tests. ?Blood tests. ?A food challenge test. This test involves eating the food that may be causing the allergic response while being monitored for a reaction by your health care provider. ?The results of an elimination diet. The elimination diet involves removing foods from your diet and then adding them back in, one at a time. ?A food diary. ?How is this treated? ?There is no cure for food allergies. Treatment focuses on preventing exposure to the food or foods you are allergic to and treating reactions if  you are exposed to the food. Mild symptoms may not need treatment.  ?Severe reactions usually need to be treated at a hospital. Treatment may include: ?Medicines that help: ?Tighten your blood vessels (epinephrine). ?Relieve itching and hives (antihistamines). ?Widen the narrow and tight airways (bronchodilators). ?Reduce swelling (corticosteroids). ?Oxygen therapy to help you breathe. ?IV fluids to keep you hydrated. ?After a severe reaction, you may be given rescue medicines, such as: ?An anaphylaxis kit. ?An epinephrine injection, commonly called an auto-injector "pen" (pre-filled automatic epinephrine injection device). ?Your health care provider may teach you how to use these if you are accidentally exposed to an allergen. ?Follow these instructions at home: ?If you have a potential allergy: ?Follow the elimination diet as told by your health care provider. ?Keep a food diary as told by your health care provider. Every day, write down: ?What you eat and drink and when. ?What symptoms you have and when. ?If you have a severe allergy: ? ?Wear a medical alert bracelet or necklace that describes your allergy. ?Carry your anaphylaxis kit or an auto-injector pen with you at all times. Use them as told by your health care provider. ?Make sure that you, your family members, and your employer know: ?The signs of anaphylaxis. ?How to use an anaphylaxis kit. ?How to use an auto-injector pen. ?If you think that you are having an anaphylactic reaction, use your auto-injector pen or anaphylaxis kit. ?Replace your epinephrine immediately after you use your auto-injector pen. This is important if you have another reaction. If possible, carry two epinephrine auto-injector pens. ?Get medical care after using your auto-injector pen. This is important because you can have a delayed, life-threatening reaction after taking the   medicine (rebound anaphylaxis). ?General instructions ?Avoid the foods that you are allergic to. ?Read food  labels before you eat packaged items. Look for ingredients that you are allergic to. ?When you are at a restaurant, tell your server that you have an allergy. If you are unsure whether a meal has an ingredient that you are allergic to, ask your server. ?Take over-the-counter and prescription medicines only as told by your health care provider. ?Do not drive or operate machinery until your health care provider says that it is safe. ?Inform all of your health care providers that you have a food allergy. ?Work with your health care providers to make an anaphylaxis plan. Preparation is important. ?If you think that you might be allergic to something else, talk with your health care provider. Do not eat a food to see if you are allergic to it without talking with your health care provider first. ?Contact a health care provider if: ?You have symptoms that do not go away within 2 days. ?You have symptoms that get worse. ?You have new symptoms. ?Get help right away if you have symptoms of anaphylaxis: ?Flushed skin. ?Hives. ?Swelling of the eyes, lips, face, mouth, tongue, or throat. ?Difficulty breathing, speaking, or swallowing. ?Wheezing. ?Dizziness, light-headedness or fainting. ?Pain or cramping in the abdomen. ?These symptoms may represent a serious problem that is an emergency. Do not wait to see if the symptoms will go away. Use your auto-injector pen or anaphylaxis kit as you have been told. Get medical help right away. Call your local emergency services (911 in the [place] Do not drive yourself to the hospital. ?If you needed to use an auto-injector pen, you need more medical care even if the medicine seems to be helping. This is important because anaphylaxis may happen again within 72 hours. ?Summary ?A food allergy is an abnormal reaction to a food (food allergen) by the body's defense system (immune system). ?There is no cure for food allergies. Treatment focuses on preventing exposure to the food or foods you  are allergic to and treating reactions if you are exposed to the food. ?Wear a medical alert bracelet or necklace that describes your allergy. ?If you have symptoms of anaphylaxis, use your auto-injector pen or anaphylaxis kit as you have been instructed, and get medical help right away. ?This information is not intended to replace advice given to you by your health care provider. Make sure you discuss any questions you have with your health care provider. ?Document Revised: 06/05/2020 Document Reviewed: 06/05/2020 ?Elsevier Patient Education ? 2022 [employer] ? ?

## 2021-01-21 ENCOUNTER — Telehealth: Payer: Medicaid Other | Admitting: Physician Assistant

## 2021-01-21 DIAGNOSIS — J069 Acute upper respiratory infection, unspecified: Secondary | ICD-10-CM

## 2021-01-21 MED ORDER — BENZONATATE 100 MG PO CAPS: 100.0000 mg | ORAL_CAPSULE | Freq: Three times a day (TID) | ORAL | 0 refills | Status: DC | PRN

## 2021-01-21 MED ORDER — FLUTICASONE PROPIONATE 50 MCG/ACT NA SUSP: 2.0000 | Freq: Every day | NASAL | 0 refills | Status: DC

## 2021-01-21 NOTE — Progress Notes (Signed)
E-Visit for Upper Respiratory Infection   We are sorry you are not feeling well.  Here is how we plan to help!  Based on what you have shared with me, it looks like you may have a viral upper respiratory infection.  Upper respiratory infections are caused by a large number of viruses; however, rhinovirus is the most common cause.   Symptoms vary from person to person, with common symptoms including sore throat, cough, fatigue or lack of energy and feeling of general discomfort.  A low-grade fever of up to 100.4 may present, but is often uncommon.  Symptoms vary however, and are closely related to a person's age or underlying illnesses.  The most common symptoms associated with an upper respiratory infection are nasal discharge or congestion, cough, sneezing, headache and pressure in the ears and face.  These symptoms usually persist for about 3 to 10 days, but can last up to 2 weeks.  It is important to know that upper respiratory infections do not cause serious illness or complications in most cases.    Upper respiratory infections can be transmitted from person to person, with the most common method of transmission being a person's hands.  The virus is able to live on the skin and can infect other persons for up to 2 hours after direct contact.  Also, these can be transmitted when someone coughs or sneezes; thus, it is important to cover the mouth to reduce this risk.  To keep the spread of the illness at bay, good hand hygiene is very important.  This is an infection that is most likely caused by a virus. There are no specific treatments other than to help you with the symptoms until the infection runs its course.  We are sorry you are not feeling well.  Here is how we plan to help!   For nasal congestion, you may use an oral decongestants such as Mucinex D or if you have glaucoma or high blood pressure use plain Mucinex.  Saline nasal spray or nasal drops can help and can safely be used as often as  needed for congestion.  For your congestion, I have prescribed Fluticasone nasal spray one spray in each nostril twice a day  If you do not have a history of heart disease, hypertension, diabetes or thyroid disease, prostate/bladder issues or glaucoma, you may also use Sudafed to treat nasal congestion.  It is highly recommended that you consult with a pharmacist or your primary care physician to ensure this medication is safe for you to take.     If you have a cough, you may use cough suppressants such as Delsym and Robitussin.  If you have glaucoma or high blood pressure, you can also use Coricidin HBP.   For cough I have prescribed for you A prescription cough medication called Tessalon Perles 100 mg. You may take 1-2 capsules every 8 hours as needed for cough  If you have a sore or scratchy throat, use a saltwater gargle-  to  teaspoon of salt dissolved in a 4-ounce to 8-ounce glass of warm water.  Gargle the solution for approximately 15-30 seconds and then spit.  It is important not to swallow the solution.  You can also use throat lozenges/cough drops and Chloraseptic spray to help with throat pain or discomfort.  Warm or cold liquids can also be helpful in relieving throat pain.  For headache, pain or general discomfort, you can use Ibuprofen or Tylenol as directed.   Some authorities believe   that zinc sprays or the use of Echinacea may shorten the course of your symptoms.   HOME CARE Only take medications as instructed by your medical team. Be sure to drink plenty of fluids. Water is fine as well as fruit juices, sodas and electrolyte beverages. You may want to stay away from caffeine or alcohol. If you are nauseated, try taking small sips of liquids. How do you know if you are getting enough fluid? Your urine should be a pale yellow or almost colorless. Get rest. Taking a steamy shower or using a humidifier may help nasal congestion and ease sore throat pain. You can place a towel over  your head and breathe in the steam from hot water coming from a faucet. Using a saline nasal spray works much the same way. Cough drops, hard candies and sore throat lozenges may ease your cough. Avoid close contacts especially the very young and the elderly Cover your mouth if you cough or sneeze Always remember to wash your hands.   GET HELP RIGHT AWAY IF: You develop worsening fever. If your symptoms do not improve within 10 days You develop yellow or green discharge from your nose over 3 days. You have coughing fits You develop a severe head ache or visual changes. You develop shortness of breath, difficulty breathing or start having chest pain Your symptoms persist after you have completed your treatment plan  MAKE SURE YOU  Understand these instructions. Will watch your condition. Will get help right away if you are not doing well or get worse.  Thank you for choosing an e-visit.  Your e-visit answers were reviewed by a board certified advanced clinical practitioner to complete your personal care plan. Depending upon the condition, your plan could have included both over the counter or prescription medications.  Please review your pharmacy choice. Make sure the pharmacy is open so you can pick up prescription now. If there is a problem, you may contact your provider through MyChart messaging and have the prescription routed to another pharmacy.  Your safety is important to us. If you have drug allergies check your prescription carefully.   For the next 24 hours you can use MyChart to ask questions about today's visit, request a non-urgent call back, or ask for a work or school excuse. You will get an email in the next two days asking about your experience. I hope that your e-visit has been valuable and will speed your recovery.  I provided 5 minutes of non face-to-face time during this encounter for chart review and documentation.   

## 2021-01-22 LAB — ALPHA-GAL PANEL
Allergen Lamb IgE: <0.1 kU/L
Beef IgE: <0.1 kU/L
IgE (Immunoglobulin E), Serum: 3 IU/mL — ABNORMAL LOW (ref 6–495)
O215-IgE Alpha-Gal: <0.1 kU/L
Pork IgE: <0.1 kU/L

## 2021-01-22 LAB — ALLERGEN PROFILE, FOOD-MILK
F076-IgE Alpha Lactalbumin: <0.1 kU/L
F077-IgE Beta Lactoglobulin: <0.1 kU/L
F078-IgE Casein: <0.1 kU/L
F081-IgE Cheese, Cheddar Type: <0.1 kU/L
F082-IgE Cheese, Mold Type: <0.1 kU/L
Milk IgE: <0.1 kU/L

## 2021-01-31 ENCOUNTER — Encounter (HOSPITAL_COMMUNITY): Payer: Medicaid Other | Admitting: Hematology

## 2021-02-04 ENCOUNTER — Encounter (HOSPITAL_COMMUNITY): Payer: Medicaid Other

## 2021-02-06 ENCOUNTER — Ambulatory Visit (HOSPITAL_COMMUNITY): Admission: RE | Admit: 2021-02-06 | Payer: Medicaid Other | Source: Home / Self Care | Admitting: Gastroenterology

## 2021-02-06 ENCOUNTER — Encounter (HOSPITAL_COMMUNITY): Admission: RE | Payer: Self-pay | Source: Home / Self Care

## 2021-02-06 SURGERY — ESOPHAGOGASTRODUODENOSCOPY (EGD) WITH PROPOFOL
Anesthesia: Monitor Anesthesia Care

## 2021-02-14 NOTE — Progress Notes (Signed)
[facility] [address], [place] [address]   CLINIC:  Medical Oncology/Hematology  Patient Care Team: Sharion Balloon, FNP as PCP - General (Family Medicine)  CHIEF COMPLAINTS/PURPOSE OF CONSULTATION:  Evaluation of IDA  HISTORY OF PRESENTING ILLNESS:  Vanessa Villanueva 30 y.o. female is here because of evaluation of IDA, at the request of [facility].  Today she reports feeling good. She reports increased fatigue over the past several months. She has been taing iron tablets once daily for the past month which she reports has not improved her energy; she had previously taken iron 1 year ago, and it did not helped then either. She denies history of iron infusion or blood transfusion. She reports ice pica over the past 2 months. She reports CP and denies ankle swellings. She currently works as a Production manager. She vapes. Her sister has anemia. She denies family history of cancer; her mother was adopted, so she is unaware of maternal family medical history.   MEDICAL HISTORY:  Past Medical History:  Diagnosis Date   Anemia     SURGICAL HISTORY: Past Surgical History:  Procedure Laterality Date   TUBAL LIGATION      SOCIAL HISTORY: Social History   Socioeconomic History   Marital status: Married    Spouse name: Not on file   Number of children: Not on file   Years of education: Not on file   Highest education level: Not on file  Occupational History   Not on file  Tobacco Use   Smoking status: Never   Smokeless tobacco: Never  Vaping Use   Vaping Use: Every day  Substance and Sexual Activity   Alcohol use: No   Drug use: No   Sexual activity: Yes    Birth control/protection: Surgical  Other Topics Concern   Not on file  Social History Narrative   Not on file   Social Determinants of Health   Financial Resource Strain: Not on file  Food Insecurity: Not on file  Transportation Needs: Not on file  Physical Activity: Not on file  Stress:  Not on file  Social Connections: Not on file  Intimate Partner Violence: Not on file    FAMILY HISTORY: Family History  Problem Relation Age of Onset   Diabetes Mother    COPD Mother    Mental illness Father        Bipolar, schizophenia    ALLERGIES:  is allergic to codeine.  MEDICATIONS:  Current Outpatient Medications  Medication Sig Dispense Refill   busPIRone (BUSPAR) 7.5 MG tablet Take 1 tablet (7.5 mg total) by mouth 4 (four) times daily. 120 tablet 2   EPINEPHrine 0.3 mg/0.3 mL IJ SOAJ injection Inject 0.3 mg into the muscle as needed for anaphylaxis. 1 each 1   escitalopram (LEXAPRO) 20 MG tablet Take 1 tablet (20 mg total) by mouth daily. 90 tablet 1   ferrous sulfate 325 (65 FE) MG tablet Take 1 tablet (325 mg total) by mouth daily with breakfast. 90 tablet 3   fluticasone (FLONASE) 50 MCG/ACT nasal spray Place 2 sprays into both nostrils daily. 16 g 0   acetaminophen (TYLENOL) 500 MG tablet Take 1 tablet (500 mg total) by mouth every 6 (six) hours as needed. (Patient not taking: Reported on 02/15/2021) 30 tablet 0   benzonatate (TESSALON) 100 MG capsule Take 1 capsule (100 mg total) by mouth 3 (three) times daily as needed. (Patient not taking: Reported on 02/15/2021) 30 capsule 0  No current facility-administered medications for this visit.    REVIEW OF SYSTEMS:   Review of Systems  Constitutional:  Positive for appetite change (40%) and fatigue (depleted).  Respiratory:  Positive for shortness of breath.   Cardiovascular:  Positive for chest pain and palpitations. Negative for leg swelling.  Gastrointestinal:  Positive for constipation.  Neurological:  Positive for headaches.  Psychiatric/Behavioral:  Positive for depression. The patient is nervous/anxious.   All other systems reviewed and are negative.   PHYSICAL EXAMINATION: ECOG PERFORMANCE STATUS: 0 - Asymptomatic  Vitals:   02/15/21 0948  BP: 100/78  Pulse: 97  Resp: 18  Temp: 98.3 F (36.8 C)   SpO2: 100%   Filed Weights   02/15/21 0948  Weight: 122 lb (55.3 kg)   Physical Exam Vitals reviewed.  Constitutional:      Appearance: Normal appearance.  Cardiovascular:     Rate and Rhythm: Normal rate and regular rhythm.     Pulses: Normal pulses.     Heart sounds: Normal heart sounds.  Pulmonary:     Effort: Pulmonary effort is normal.     Breath sounds: Normal breath sounds.  Abdominal:     Palpations: Abdomen is soft. There is no hepatomegaly, splenomegaly or mass.     Tenderness: There is no abdominal tenderness.  Neurological:     General: No focal deficit present.     Mental Status: She is alert and oriented to person, place, and time.  Psychiatric:        Mood and Affect: Mood normal.        Behavior: Behavior normal.     LABORATORY DATA:  I have reviewed the data as listed Recent Results (from the past 2160 hour(s))  Iron, Total/Total Iron Binding Cap     Status: Abnormal   Collection Time: 01/07/21 11:09 AM  Result Value Ref Range   Iron 45 40 - 190 mcg/dL   TIBC 385 250 - 450 mcg/dL (calc)   %SAT 12 (L) 16 - 45 % (calc)  CBC with Differential/Platelet     Status: Abnormal   Collection Time: 01/07/21 11:09 AM  Result Value Ref Range   WBC 5.7 3.8 - 10.8 Thousand/uL   RBC 3.86 3.80 - 5.10 Million/uL   Hemoglobin 10.8 (L) 11.7 - 15.5 g/dL   HCT 34.2 (L) 35.0 - 45.0 %   MCV 88.6 80.0 - 100.0 fL   MCH 28.0 27.0 - 33.0 pg   MCHC 31.6 (L) 32.0 - 36.0 g/dL   RDW 13.9 11.0 - 15.0 %   Platelets 362 140 - 400 Thousand/uL   MPV 12.2 7.5 - 12.5 fL   Neutro Abs 3,773 1,500 - 7,800 cells/uL   Lymphs Abs 1,391 850 - 3,900 cells/uL   Absolute Monocytes 405 200 - 950 cells/uL   Eosinophils Absolute 91 15 - 500 cells/uL   Basophils Absolute 40 0 - 200 cells/uL   Neutrophils Relative % 66.2 %   Total Lymphocyte 24.4 %   Monocytes Relative 7.1 %   Eosinophils Relative 1.6 %   Basophils Relative 0.7 %  Ferritin     Status: Abnormal   Collection Time: 01/07/21  11:09 AM  Result Value Ref Range   Ferritin 6 (L) 16 - 154 ng/mL  Comprehensive metabolic panel     Status: Abnormal   Collection Time: 01/07/21 11:09 AM  Result Value Ref Range   Glucose, Bld 86 65 - 99 mg/dL    Comment: .  Fasting reference interval .    BUN 5 (L) 7 - 25 mg/dL   Creat 0.72 0.50 - 0.97 mg/dL   BUN/Creatinine Ratio 7 6 - 22 (calc)   Sodium 139 135 - 146 mmol/L   Potassium 4.4 3.5 - 5.3 mmol/L   Chloride 105 98 - 110 mmol/L   CO2 24 20 - 32 mmol/L   Calcium 9.5 8.6 - 10.2 mg/dL   Total Protein 7.6 6.1 - 8.1 g/dL   Albumin 4.6 3.6 - 5.1 g/dL   Globulin 3.0 1.9 - 3.7 g/dL (calc)   AG Ratio 1.5 1.0 - 2.5 (calc)   Total Bilirubin 0.9 0.2 - 1.2 mg/dL   Alkaline phosphatase (APISO) 48 31 - 125 U/L   AST 13 10 - 30 U/L   ALT 5 (L) 6 - 29 U/L  Celiac Disease Panel     Status: None   Collection Time: 01/07/21 11:09 AM  Result Value Ref Range   Immunoglobulin A 169 47 - 310 mg/dL   (tTG) Ab, IgG <1.0 U/mL    Comment: Value          Interpretation -----          -------------- <15.0          Antibody not detected > or = 15.0    Antibody detected .    (tTG) Ab, IgA <1.0 U/mL    Comment: Value          Interpretation -----          -------------- <15.0          Antibody not detected > or = 15.0    Antibody detected .    Gliadin IgA <1.0 U/mL    Comment: Value          Interpretation -----          -------------- <15.0          Antibody not detected > or = 15.0    Antibody detected .    Gliadin IgG <1.0 U/mL    Comment: Value          Interpretation -----          -------------- <15.0          Antibody not detected > or = 15.0    Antibody detected .   Allergen Profile, Food-Milk     Status: None   Collection Time: 01/16/21  9:28 AM  Result Value Ref Range   Milk IgE <0.10 Class 0 kU/L   F076-IgE Alpha Lactalbumin <0.10 Class 0 kU/L   F077-IgE Beta Lactoglobulin <0.10 Class 0 kU/L   F078-IgE Casein <0.10 Class 0 kU/L   F081-IgE Cheese,  Cheddar Type <0.10 Class 0 kU/L   F082-IgE Cheese, Mold Type <0.10 Class 0 kU/L  Alpha-Gal Panel     Status: Abnormal   Collection Time: 01/16/21  9:28 AM  Result Value Ref Range   Class Description Allergens Comment     Comment:     Levels of Specific IgE       Class  Description of Class     ---------------------------  -----  --------------------                    < 0.10         0         Negative            0.10 -    0.31  0/I       Equivocal/Low            0.32 -    0.55         I         Low            0.56 -    1.40         II        Moderate            1.41 -    3.90         III       High            3.91 -   19.00         IV        Very High           19.01 -  100.00         V         Very High                   >100.00         VI        Very High    IgE (Immunoglobulin E), Serum 3 (L) 6 - 495 IU/mL   O215-IgE Alpha-Gal <0.10 Class 0 kU/L   Beef IgE <0.10 Class 0 kU/L   Pork IgE <0.10 Class 0 kU/L   Allergen Lamb IgE <0.10 Class 0 kU/L    RADIOGRAPHIC STUDIES: I have personally reviewed the radiological images as listed and agreed with the findings in the report. No results found.  ASSESSMENT:  Severe iron deficiency anemia: - Patient seen at the request of [name] for iron deficiency. - Labs on 01/07/2021 with ferritin 6, hemoglobin 10.8 and MCV 88.6. - She reports tiredness for several months.  Denies any bleeding per rectum or melena. - She is taking iron tablet daily for the last 1 month.  She also took it last year but did not feel any difference. - Denies any prior blood transfusions or iron infusions. - Reports ice pica for the last 2 months.   Social/family history: - She lives at home with her husband.  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 - Sister had anemia.  No cancer in the family.  Her mother was adopted.   PLAN:  Severe iron deficiency anemia: - I have reviewed labs from 01/07/2021 with her in detail. - Because of  severe fatigue, no improvement with iron pill for the last 1 month, I have recommended parenteral iron therapy. - We discussed Feraheme weekly x2.  We discussed side effects including serious allergic reactions. - We will schedule her for 2 infusions of Feraheme. - RTC 2 months for follow-up.  We will check CBC, ferritin and iron panel.  We will also check other nutritional deficiencies including J62, folic acid, methylmalonic acid and copper.   All questions were answered. The patient knows to call the clinic with any problems, questions or concerns.  Derek Jack, MD 02/15/21 10:06 AM  [place] [phone]   I, Thana Ates, am acting as a scribe for Dr. Derek Jack.  I, Derek Jack MD, have reviewed the above documentation for accuracy and completeness, and I agree with the above.

## 2021-02-15 ENCOUNTER — Other Ambulatory Visit: Payer: Self-pay

## 2021-02-15 ENCOUNTER — Inpatient Hospital Stay (HOSPITAL_COMMUNITY): Payer: Medicaid Other | Attending: Hematology | Admitting: Hematology

## 2021-02-15 ENCOUNTER — Encounter (HOSPITAL_COMMUNITY): Payer: Self-pay | Admitting: Hematology

## 2021-02-15 VITALS — BP 100/78 | HR 97 | Temp 98.3°F | Resp 18 | Ht 61.0 in | Wt 122.0 lb

## 2021-02-15 DIAGNOSIS — D509 Iron deficiency anemia, unspecified: Secondary | ICD-10-CM

## 2021-02-15 DIAGNOSIS — F5089 Other specified eating disorder: Secondary | ICD-10-CM

## 2021-02-15 DIAGNOSIS — D649 Anemia, unspecified: Secondary | ICD-10-CM

## 2021-02-15 DIAGNOSIS — R5383 Other fatigue: Secondary | ICD-10-CM

## 2021-02-15 NOTE — Patient Instructions (Signed)
[facility] at [facility] Discharge Instructions  You were seen and examined today by Dr. Delton Coombes. He is a Hematologist/Oncologist meaning that he specializes in the treatment of blood disorders and cancers. He reviewed your most recent labs and they are showing that your iron is low. Dr. Delton Coombes recommends Feraheme (iron) infusion to help increase your iron levels. Please keep follow up appointment as scheduled.   Thank you for choosing [place] at [facility] to provide your oncology and hematology care.  To afford each patient quality time with our provider, please arrive at least 15 minutes before your scheduled appointment time.   If you have a lab appointment with the [facility] please come in thru the Main Entrance and check in at the main information desk.  You need to re-schedule your appointment should you arrive 10 or more minutes late.  We strive to give you quality time with our providers, and arriving late affects you and other patients whose appointments are after yours.  Also, if you no show three or more times for appointments you may be dismissed from the clinic at the providers discretion.     Again, thank you for choosing [facility]  Our hope is that these requests will decrease the amount of time that you wait before being seen by our physicians.       _____________________________________________________________  Should you have questions after your visit to [facility], please contact our office at [phone] and follow the prompts.  Our office hours are 8:00 a.m. and 4:30 p.m. Monday - Friday.  Please note that voicemails left after 4:00 p.m. may not be returned until the following business day.  We are closed weekends and major holidays.  You do have access to a nurse 24-7, just call the main number to the clinic [phone] and do not press any options, hold on the line and a nurse will  answer the phone.    For prescription refill requests, have your pharmacy contact our office and allow 72 hours.    Due to Covid, you will need to wear a mask upon entering the hospital. If you do not have a mask, a mask will be given to you at the Main Entrance upon arrival. For doctor visits, patients may have 1 support person age 27 or older with them. For treatment visits, patients can not have anyone with them due to social distancing guidelines and our immunocompromised population.

## 2021-02-21 ENCOUNTER — Encounter (HOSPITAL_COMMUNITY): Payer: Self-pay

## 2021-02-21 ENCOUNTER — Inpatient Hospital Stay (HOSPITAL_COMMUNITY): Payer: Medicaid Other

## 2021-02-21 ENCOUNTER — Other Ambulatory Visit: Payer: Self-pay

## 2021-02-21 VITALS — BP 95/75 | HR 79 | Temp 97.3°F | Resp 18

## 2021-02-21 DIAGNOSIS — D509 Iron deficiency anemia, unspecified: Secondary | ICD-10-CM

## 2021-02-21 MED ORDER — SODIUM CHLORIDE 0.9% FLUSH
10.0000 mL | Freq: Once | INTRAVENOUS | Status: AC | PRN
  Administered 2021-02-21: 11:00:00 10 mL

## 2021-02-21 MED ORDER — SODIUM CHLORIDE 0.9 % IV SOLN
510.0000 mg | Freq: Once | INTRAVENOUS | Status: AC
  Administered 2021-02-21: 11:00:00 510 mg via INTRAVENOUS
  Filled 2021-02-21: qty 510

## 2021-02-21 MED ORDER — SODIUM CHLORIDE 0.9 % IV SOLN: Freq: Once | INTRAVENOUS | Status: AC

## 2021-02-21 NOTE — Progress Notes (Signed)
Patient tolerated iron infusion with no complaints voiced.  Peripheral IV site clean and dry with good blood return noted before and after infusion.  Band aid applied.  VSS with discharge and left in satisfactory condition with no s/s of distress noted.   

## 2021-02-21 NOTE — Patient Instructions (Signed)

## 2021-03-08 ENCOUNTER — Inpatient Hospital Stay (HOSPITAL_COMMUNITY): Payer: Medicaid Other | Attending: Hematology

## 2021-03-08 ENCOUNTER — Other Ambulatory Visit: Payer: Self-pay

## 2021-03-08 VITALS — BP 97/67 | HR 102 | Temp 98.9°F | Resp 18

## 2021-03-08 DIAGNOSIS — D509 Iron deficiency anemia, unspecified: Secondary | ICD-10-CM | POA: Diagnosis not present

## 2021-03-08 MED ORDER — SODIUM CHLORIDE 0.9 % IV SOLN
510.0000 mg | Freq: Once | INTRAVENOUS | Status: AC
  Administered 2021-03-08: 510 mg via INTRAVENOUS
  Filled 2021-03-08: qty 510

## 2021-03-08 MED ORDER — SODIUM CHLORIDE 0.9 % IV SOLN: Freq: Once | INTRAVENOUS | Status: AC

## 2021-03-08 NOTE — Progress Notes (Signed)
Patient presents today for iron infusion.  Patient is in satisfactory condition with no new complaints voiced.  Vital signs are stable.  We will proceed with treatment per MD orders.   Patient tolerated treatment well with no complaints voiced.  Patient left ambulatory in stable condition.  Vital signs stable at discharge.  Follow up as scheduled.    

## 2021-03-08 NOTE — Patient Instructions (Signed)
[facility]  Discharge Instructions: Thank you for choosing [facility] to provide your oncology and hematology care.  If you have a lab appointment with the Cancer Center, please come in thru the Main Entrance and check in at the main information desk.  Wear comfortable clothing and clothing appropriate for easy access to any Portacath or PICC line.   We strive to give you quality time with your provider. You may need to reschedule your appointment if you arrive late (15 or more minutes).  Arriving late affects you and other patients whose appointments are after yours.  Also, if you miss three or more appointments without notifying the office, you may be dismissed from the clinic at the provider's discretion.      For prescription refill requests, have your pharmacy contact our office and allow 72 hours for refills to be completed.        To help prevent nausea and vomiting after your treatment, we encourage you to take your nausea medication as directed.  BELOW ARE SYMPTOMS THAT SHOULD BE REPORTED IMMEDIATELY: *FEVER GREATER THAN 100.4 F (38 C) OR HIGHER *CHILLS OR SWEATING *NAUSEA AND VOMITING THAT IS NOT CONTROLLED WITH YOUR NAUSEA MEDICATION *UNUSUAL SHORTNESS OF BREATH *UNUSUAL BRUISING OR BLEEDING *URINARY PROBLEMS (pain or burning when urinating, or frequent urination) *BOWEL PROBLEMS (unusual diarrhea, constipation, pain near the anus) TENDERNESS IN MOUTH AND THROAT WITH OR WITHOUT PRESENCE OF ULCERS (sore throat, sores in mouth, or a toothache) UNUSUAL RASH, SWELLING OR PAIN  UNUSUAL VAGINAL DISCHARGE OR ITCHING   Items with * indicate a potential emergency and should be followed up as soon as possible or go to the Emergency Department if any problems should occur.  Please show the CHEMOTHERAPY ALERT CARD or IMMUNOTHERAPY ALERT CARD at check-in to the Emergency Department and triage nurse.  Should you have questions after your visit or need to cancel  or reschedule your appointment, please contact [facility] [phone]  and follow the prompts.  Office hours are 8:00 a.m. to 4:30 p.m. Monday - Friday. Please note that voicemails left after 4:00 p.m. may not be returned until the following business day.  We are closed weekends and major holidays. You have access to a nurse at all times for urgent questions. Please call the main number to the clinic [phone] and follow the prompts.  For any non-urgent questions, you may also contact your provider using MyChart. We now offer e-Visits for anyone 18 and older to request care online for non-urgent symptoms. For details visit [email or web address].   Also download the MyChart app! Go to the app store, search "MyChart", open the app, select , and log in with your MyChart username and password.  Due to Covid, a mask is required upon entering the hospital/clinic. If you do not have a mask, one will be given to you upon arrival. For doctor visits, patients may have 1 support person aged 18 or older with them. For treatment visits, patients cannot have anyone with them due to current Covid guidelines and our immunocompromised population.  

## 2021-04-03 ENCOUNTER — Encounter (HOSPITAL_COMMUNITY): Payer: Self-pay

## 2021-04-10 ENCOUNTER — Encounter (HOSPITAL_COMMUNITY): Payer: Self-pay | Admitting: Hematology

## 2021-04-10 ENCOUNTER — Encounter: Payer: Self-pay | Admitting: Family Medicine

## 2021-04-10 ENCOUNTER — Ambulatory Visit: Payer: Medicaid Other | Admitting: Family Medicine

## 2021-04-10 VITALS — BP 100/71 | HR 93 | Temp 97.7°F | Ht 61.0 in | Wt 115.4 lb

## 2021-04-10 DIAGNOSIS — R051 Acute cough: Secondary | ICD-10-CM

## 2021-04-10 DIAGNOSIS — J069 Acute upper respiratory infection, unspecified: Secondary | ICD-10-CM | POA: Diagnosis not present

## 2021-04-10 NOTE — Progress Notes (Signed)
Assessment & Plan:  1. Viral URI Discussed symptom management.  2. Acute cough - COVID-19, Flu A+B and RSV   Follow up plan: Return if symptoms worsen or fail to improve.  Hendricks Limes, MSN, APRN, FNP-C [facility]  Subjective:   Patient ID: Vanessa Villanueva, female    DOB: 1990-08-11, 31 y.o.   MRN: [id number]  HPI: Vanessa Villanueva is a 31 y.o. female presenting on 04/10/2021 for Cough, Nasal Congestion, and Generalized Body Aches (X 3 days)  Patient complains of cough, head congestion, headache, sneezing, sore throat, swollen lymph nodes, and body aches . Onset of symptoms was 3 days ago, gradually worsening since that time. She is drinking plenty of fluids. Evaluation to date: none. Treatment to date:  Theraflu, Advil Cold & Sinus . She does vape.    ROS: Negative unless specifically indicated above in HPI.   Relevant past medical history reviewed and updated as indicated.   Allergies and medications reviewed and updated.   Current Outpatient Medications:    acetaminophen (TYLENOL) 500 MG tablet, Take 1 tablet (500 mg total) by mouth every 6 (six) hours as needed., Disp: 30 tablet, Rfl: 0   busPIRone (BUSPAR) 7.5 MG tablet, Take 1 tablet (7.5 mg total) by mouth 4 (four) times daily., Disp: 120 tablet, Rfl: 2   EPINEPHrine 0.3 mg/0.3 mL IJ SOAJ injection, Inject 0.3 mg into the muscle as needed for anaphylaxis., Disp: 1 each, Rfl: 1   escitalopram (LEXAPRO) 20 MG tablet, Take 1 tablet (20 mg total) by mouth daily., Disp: 90 tablet, Rfl: 1   ferrous sulfate 325 (65 FE) MG tablet, Take 1 tablet (325 mg total) by mouth daily with breakfast., Disp: 90 tablet, Rfl: 3   fluticasone (FLONASE) 50 MCG/ACT nasal spray, Place 2 sprays into both nostrils daily., Disp: 16 g, Rfl: 0  Allergies  Allergen Reactions   Codeine Nausea And Vomiting    Objective:   BP 100/71    Pulse 93    Temp 97.7 F (36.5 C)    Ht 5\' 1"  (1.549 m)    Wt 115 lb 6.4 oz (52.3  kg)    SpO2 100%    BMI 21.80 kg/m    Physical Exam Vitals reviewed.  Constitutional:      General: She is not in acute distress.    Appearance: Normal appearance. She is not ill-appearing, toxic-appearing or diaphoretic.  HENT:     Head: Normocephalic and atraumatic.     Right Ear: Tympanic membrane, ear canal and external ear normal. There is no impacted cerumen.     Left Ear: Tympanic membrane, ear canal and external ear normal. There is no impacted cerumen.     Nose: Congestion present. No rhinorrhea.     Mouth/Throat:     Mouth: Mucous membranes are moist.     Pharynx: Oropharynx is clear. No oropharyngeal exudate or posterior oropharyngeal erythema.  Eyes:     General: No scleral icterus.       [name]: No discharge.        [name]: No discharge.     Conjunctiva/sclera: Conjunctivae normal.  Cardiovascular:     Rate and Rhythm: Normal rate and regular rhythm.     Heart sounds: Normal heart sounds. No murmur heard.   No friction rub. No gallop.  Pulmonary:     Effort: Pulmonary effort is normal. No respiratory distress.     Breath sounds: Normal breath sounds. No stridor. No wheezing, rhonchi or  rales.  Musculoskeletal:        General: Normal range of motion.     Cervical back: Normal range of motion.  Lymphadenopathy:     Cervical: Cervical adenopathy present.  Skin:    General: Skin is warm and dry.     Capillary Refill: Capillary refill takes less than 2 seconds.  Neurological:     General: No focal deficit present.     Mental Status: She is alert and oriented to person, place, and time. Mental status is at baseline.  Psychiatric:        Mood and Affect: Mood normal.        Behavior: Behavior normal.        Thought Content: Thought content normal.        Judgment: Judgment normal.

## 2021-04-11 LAB — COVID-19, FLU A+B AND RSV
Influenza A, NAA: NOT DETECTED
Influenza B, NAA: NOT DETECTED
RSV, NAA: NOT DETECTED
SARS-CoV-2, NAA: NOT DETECTED

## 2021-04-15 ENCOUNTER — Encounter: Payer: Self-pay | Admitting: Family Medicine

## 2021-04-18 ENCOUNTER — Other Ambulatory Visit (HOSPITAL_COMMUNITY): Payer: Medicaid Other

## 2021-04-19 ENCOUNTER — Encounter: Payer: Self-pay | Admitting: Family Medicine

## 2021-04-19 ENCOUNTER — Ambulatory Visit: Payer: Medicaid Other | Admitting: Family Medicine

## 2021-04-19 VITALS — BP 105/75 | HR 88 | Temp 98.1°F | Ht 62.0 in | Wt 113.0 lb

## 2021-04-19 DIAGNOSIS — Z30011 Encounter for initial prescription of contraceptive pills: Secondary | ICD-10-CM

## 2021-04-19 DIAGNOSIS — N920 Excessive and frequent menstruation with regular cycle: Secondary | ICD-10-CM | POA: Diagnosis not present

## 2021-04-19 DIAGNOSIS — N939 Abnormal uterine and vaginal bleeding, unspecified: Secondary | ICD-10-CM

## 2021-04-19 DIAGNOSIS — E538 Deficiency of other specified B group vitamins: Secondary | ICD-10-CM

## 2021-04-19 NOTE — Progress Notes (Signed)
Assessment & Plan:  1-2. Abnormal uterine bleeding/Menorrhagia with regular cycle Lab work today.  If anemia is significantly worse we will notify her hematologist.  Pelvic ultrasound ordered.  If lab work confirms that patient was not pregnant she would like to start an extended cycle oral birth control.  Education provided on abnormal uterine bleeding. - Beta hCG quant (ref lab) - Anemia Profile B - US Pelvic Complete With Transvaginal; Future   Hendricks Limes, MSN, APRN, FNP-C [facility]  Subjective:   Patient ID: Vanessa Villanueva, female    DOB: February 21, 1991, 31 y.o.   MRN: [id number]  HPI: Vanessa Villanueva is a 31 y.o. female presenting on 04/19/2021 for Menstrual Problem (Heavy bleeding and kept on passing clots (size of golf ball) for about 3 days. It stopped last night but it started again this morning. Concerns about size of clots discharging.)  Patient reports her period started 3 days ago.  That night when she got up to go to the restroom she states blood came pouring out of her and was running down both of her legs causing a trail of blood from the bed to the bathroom.  She passed 2 very large clots that she has brought a picture of today.  She continued to pass golf ball size clots until yesterday at which point the bleeding started to subside.  However, the bleeding started back again heavy this morning.  She has been bleeding so heavily that she saturates a heavy pad in one hour.  During this time she has been experiencing low back pain and stomach cramps that she reports are not like her normal period cramps.  Her cycles are very regular and come monthly.  States she does normally have periods on the heavier side with clots, but never this large.  She has had a tubal ligation but states she was told the last time she had an x-ray that they were unable to visualize her clips.  She has not had sex since before Thanksgiving and reports she has had bleeding  monthly since then, although it was not as heavy as it usually is.  She does have a history of anemia for which she receives iron transfusions; this is managed by her hematologist.  Her last iron infusion was at the beginning of January.  She does take ferrous sulfate once daily.   ROS: Negative unless specifically indicated above in HPI.   Relevant past medical history reviewed and updated as indicated.   Allergies and medications reviewed and updated.   Current Outpatient Medications:    acetaminophen (TYLENOL) 500 MG tablet, Take 1 tablet (500 mg total) by mouth every 6 (six) hours as needed., Disp: 30 tablet, Rfl: 0   busPIRone (BUSPAR) 7.5 MG tablet, Take 1 tablet (7.5 mg total) by mouth 4 (four) times daily., Disp: 120 tablet, Rfl: 2   EPINEPHrine 0.3 mg/0.3 mL IJ SOAJ injection, Inject 0.3 mg into the muscle as needed for anaphylaxis., Disp: 1 each, Rfl: 1   escitalopram (LEXAPRO) 20 MG tablet, Take 1 tablet (20 mg total) by mouth daily., Disp: 90 tablet, Rfl: 1   ferrous sulfate 325 (65 FE) MG tablet, Take 1 tablet (325 mg total) by mouth daily with breakfast., Disp: 90 tablet, Rfl: 3  Allergies  Allergen Reactions   Codeine Nausea And Vomiting    Objective:   BP 105/75    Pulse 88    Temp 98.1 F (36.7 C) (Temporal)    Ht 5'  2" (1.575 m)    Wt 113 lb (51.3 kg)    SpO2 99%    BMI 20.67 kg/m    Physical Exam Vitals reviewed.  Constitutional:      General: She is not in acute distress.    Appearance: Normal appearance. She is not ill-appearing, toxic-appearing or diaphoretic.  HENT:     Head: Normocephalic and atraumatic.  Eyes:     General: No scleral icterus.       [name]: No discharge.        [name]: No discharge.     Conjunctiva/sclera: Conjunctivae normal.  Cardiovascular:     Rate and Rhythm: Normal rate.  Pulmonary:     Effort: Pulmonary effort is normal. No respiratory distress.  Musculoskeletal:        General: Normal range of motion.     Cervical  back: Normal range of motion.  Skin:    General: Skin is warm and dry.     Capillary Refill: Capillary refill takes less than 2 seconds.  Neurological:     General: No focal deficit present.     Mental Status: She is alert and oriented to person, place, and time. Mental status is at baseline.  Psychiatric:        Mood and Affect: Mood normal.        Behavior: Behavior normal.        Thought Content: Thought content normal.        Judgment: Judgment normal.

## 2021-04-20 DIAGNOSIS — E538 Deficiency of other specified B group vitamins: Secondary | ICD-10-CM

## 2021-04-20 HISTORY — DX: Deficiency of other specified B group vitamins: E53.8

## 2021-04-20 LAB — ANEMIA PROFILE B
Basophils Absolute: 0 10*3/uL (ref 0.0–0.2)
Basos: 1 %
EOS (ABSOLUTE): 0.1 10*3/uL (ref 0.0–0.4)
Eos: 1 %
Ferritin: 422 ng/mL — ABNORMAL HIGH (ref 15–150)
Folate: 7.8 ng/mL (ref >3.0)
Hematocrit: 32.7 % — ABNORMAL LOW (ref 34.0–46.6)
Hemoglobin: 11.1 g/dL (ref 11.1–15.9)
Immature Grans (Abs): 0 10*3/uL (ref 0.0–0.1)
Immature Granulocytes: 0 %
Iron Saturation: 36 % (ref 15–55)
Iron: 87 ug/dL (ref 27–159)
Lymphocytes Absolute: 2.2 10*3/uL (ref 0.7–3.1)
Lymphs: 27 %
MCH: 32.7 pg (ref 26.6–33.0)
MCHC: 33.9 g/dL (ref 31.5–35.7)
MCV: 97 fL (ref 79–97)
Monocytes Absolute: 0.6 10*3/uL (ref 0.1–0.9)
Monocytes: 7 %
Neutrophils Absolute: 5.2 10*3/uL (ref 1.4–7.0)
Neutrophils: 64 %
Platelets: 339 10*3/uL (ref 150–450)
RBC: 3.39 x10E6/uL — ABNORMAL LOW (ref 3.77–5.28)
RDW: 13.8 % (ref 11.7–15.4)
Retic Ct Pct: 1.8 % (ref 0.6–2.6)
Total Iron Binding Capacity: 239 ug/dL — ABNORMAL LOW (ref 250–450)
UIBC: 152 ug/dL (ref 131–425)
Vitamin B-12: 142 pg/mL — ABNORMAL LOW (ref 232–1245)
WBC: 8.1 10*3/uL (ref 3.4–10.8)

## 2021-04-20 LAB — BETA HCG QUANT (REF LAB): hCG Quant: <1 m[IU]/mL

## 2021-04-21 ENCOUNTER — Encounter: Payer: Self-pay | Admitting: Family Medicine

## 2021-04-21 MED ORDER — LEVONORGESTREL-ETHINYL ESTRAD 90-20 MCG PO TABS: 1.0000 | ORAL_TABLET | Freq: Every day | ORAL | 1 refills | Status: DC

## 2021-04-21 NOTE — Addendum Note (Signed)
Addended by: Loman Brooklyn on: 04/21/2021 10:49 AM   Modules accepted: Orders

## 2021-04-22 NOTE — Telephone Encounter (Signed)
Patient sent to have documentation in her chart so that she could delete them off her phone. These are not new.

## 2021-04-22 NOTE — Telephone Encounter (Signed)
Brittney to review

## 2021-04-25 ENCOUNTER — Ambulatory Visit (HOSPITAL_COMMUNITY): Payer: Medicaid Other | Admitting: Physician Assistant

## 2021-04-30 ENCOUNTER — Other Ambulatory Visit (HOSPITAL_COMMUNITY): Payer: Medicaid Other

## 2021-04-30 ENCOUNTER — Ambulatory Visit (HOSPITAL_COMMUNITY): Admission: RE | Admit: 2021-04-30 | Payer: Medicaid Other | Source: Ambulatory Visit

## 2021-05-02 ENCOUNTER — Inpatient Hospital Stay (HOSPITAL_COMMUNITY): Payer: Medicaid Other | Attending: Hematology

## 2021-05-02 DIAGNOSIS — D509 Iron deficiency anemia, unspecified: Secondary | ICD-10-CM | POA: Diagnosis not present

## 2021-05-02 DIAGNOSIS — D649 Anemia, unspecified: Secondary | ICD-10-CM

## 2021-05-02 DIAGNOSIS — E538 Deficiency of other specified B group vitamins: Secondary | ICD-10-CM | POA: Diagnosis not present

## 2021-05-02 DIAGNOSIS — R109 Unspecified abdominal pain: Secondary | ICD-10-CM | POA: Insufficient documentation

## 2021-05-02 LAB — CBC WITH DIFFERENTIAL/PLATELET
Abs Immature Granulocytes: 0.02 10*3/uL (ref 0.00–0.07)
Basophils Absolute: 0 10*3/uL (ref 0.0–0.1)
Basophils Relative: 1 %
Eosinophils Absolute: 0.1 10*3/uL (ref 0.0–0.5)
Eosinophils Relative: 1 %
HCT: 34.4 % — ABNORMAL LOW (ref 36.0–46.0)
Hemoglobin: 10.7 g/dL — ABNORMAL LOW (ref 12.0–15.0)
Immature Granulocytes: 0 %
Lymphocytes Relative: 18 %
Lymphs Abs: 1.2 10*3/uL (ref 0.7–4.0)
MCH: 32.5 pg (ref 26.0–34.0)
MCHC: 31.1 g/dL (ref 30.0–36.0)
MCV: 104.6 fL — ABNORMAL HIGH (ref 80.0–100.0)
Monocytes Absolute: 0.4 10*3/uL (ref 0.1–1.0)
Monocytes Relative: 5 %
Neutro Abs: 5.1 10*3/uL (ref 1.7–7.7)
Neutrophils Relative %: 75 %
Platelets: 295 10*3/uL (ref 150–400)
RBC: 3.29 MIL/uL — ABNORMAL LOW (ref 3.87–5.11)
RDW: 15.3 % (ref 11.5–15.5)
WBC: 6.8 10*3/uL (ref 4.0–10.5)
nRBC: 0 % (ref 0.0–0.2)

## 2021-05-02 LAB — LACTATE DEHYDROGENASE: LDH: 114 U/L (ref 98–192)

## 2021-05-02 LAB — RETICULOCYTES
Immature Retic Fract: 8.3 % (ref 2.3–15.9)
RBC.: 3.31 MIL/uL — ABNORMAL LOW (ref 3.87–5.11)
Retic Count, Absolute: 86.1 10*3/uL (ref 19.0–186.0)
Retic Ct Pct: 2.6 % (ref 0.4–3.1)

## 2021-05-02 LAB — IRON AND TIBC
Iron: 99 ug/dL (ref 28–170)
Saturation Ratios: 32 % — ABNORMAL HIGH (ref 10.4–31.8)
TIBC: 312 ug/dL (ref 250–450)
UIBC: 213 ug/dL

## 2021-05-02 LAB — FOLATE: Folate: 10.5 ng/mL (ref >5.9)

## 2021-05-02 LAB — VITAMIN B12: Vitamin B-12: 181 pg/mL (ref 180–914)

## 2021-05-02 LAB — FERRITIN: Ferritin: 148 ng/mL (ref 11–307)

## 2021-05-03 LAB — COPPER, SERUM: Copper: 151 ug/dL (ref 80–158)

## 2021-05-05 LAB — METHYLMALONIC ACID, SERUM: Methylmalonic Acid, Quantitative: 121 nmol/L (ref 0–378)

## 2021-05-07 NOTE — Progress Notes (Signed)
Has follow up on 05/09/21

## 2021-05-08 NOTE — Progress Notes (Signed)
Vanessa Villanueva, [place] [address]   CLINIC:  Medical Oncology/Hematology  PCP:  Sharion Balloon, [place] [address] [phone]   REASON FOR VISIT:  Follow-up for iron deficiency anemia  PRIOR THERAPY: Iron tablets  CURRENT THERAPY: IV Feraheme (02/21/2021 and 03/08/2021)  INTERVAL HISTORY:  Vanessa Villanueva 31 y.o. female returns for routine follow-up of her iron deficiency anemia.  She was last seen by Dr. Delton Coombes on 02/15/2021.  At today's visit, she reports feeling poorly.  She is continuing to have significant ongoing GI symptoms with stabbing epigastric pain triggered by food that radiates substernally as well as radiating "straight through to her back."  This is associated with some nausea.  She saw [name] for this last fall, but did not follow-up with him again.  After her IV iron in December 2022, she had some temporary improvement in her fatigue that lasted about 2 weeks.  At this time, she reports significant fatigue as well as ice pica, headaches, and dyspnea on exertion.  She reports an extremely heavy period last month with "blood gushing out of her."  She started hormonal birth control 3 weeks ago to try to improve her bleeding.  She has not noticed any other bleeding such as melena, hematochezia, or epistaxis.  She has some numbness and tingling in her hands.  She is taking iron tablet daily and was started on B12 supplement by her PCP several weeks ago.  She has 50% energy and 50% appetite. She has lost about 15 pounds since her last visit in November 2022, which she attributes to her GI symptoms and subsequent decreased appetite.   REVIEW OF SYSTEMS:  Review of Systems  Constitutional:  Positive for appetite change, fatigue and unexpected weight change. Negative for chills, diaphoresis and fever.  HENT:   Negative for lump/mass and nosebleeds.   Eyes:  Negative for eye problems.  Respiratory:   Positive for shortness of breath. Negative for cough and hemoptysis.   Cardiovascular:  Positive for chest pain (Substernal chest pain radiating from epigastrium). Negative for leg swelling and palpitations.  Gastrointestinal:  Negative for abdominal pain, blood in stool, constipation, diarrhea, nausea and vomiting.  Genitourinary:  Negative for hematuria.   Skin: Negative.   Neurological:  Positive for headaches and numbness. Negative for dizziness and light-headedness.  Hematological:  Does not bruise/bleed easily.  Psychiatric/Behavioral:  Positive for sleep disturbance.      PAST MEDICAL/SURGICAL HISTORY:  Past Medical History:  Diagnosis Date   Anemia    Vitamin B12 deficiency 04/20/2021   Past Surgical History:  Procedure Laterality Date   TUBAL LIGATION       SOCIAL HISTORY:  Social History   Socioeconomic History   Marital status: Married    Spouse name: Not on file   Number of children: Not on file   Years of education: Not on file   Highest education level: Not on file  Occupational History   Not on file  Tobacco Use   Smoking status: Never   Smokeless tobacco: Never  Vaping Use   Vaping Use: Every day  Substance and Sexual Activity   Alcohol use: No   Drug use: No   Sexual activity: Yes    Birth control/protection: Surgical  Other Topics Concern   Not on file  Social History Narrative   Not on file   Social Determinants of Health   Financial Resource Strain: Not on file  Food  Insecurity: Not on file  Transportation Needs: Not on file  Physical Activity: Not on file  Stress: Not on file  Social Connections: Not on file  Intimate Partner Violence: Not on file    FAMILY HISTORY:  Family History  Problem Relation Age of Onset   Diabetes Mother    COPD Mother    Mental illness Father        Bipolar, schizophenia    CURRENT MEDICATIONS:  Outpatient Encounter Medications as of 05/09/2021  Medication Sig   acetaminophen (TYLENOL) 500 MG tablet  Take 1 tablet (500 mg total) by mouth every 6 (six) hours as needed.   busPIRone (BUSPAR) 7.5 MG tablet Take 1 tablet (7.5 mg total) by mouth 4 (four) times daily.   EPINEPHrine 0.3 mg/0.3 mL IJ SOAJ injection Inject 0.3 mg into the muscle as needed for anaphylaxis.   escitalopram (LEXAPRO) 20 MG tablet Take 1 tablet (20 mg total) by mouth daily.   ferrous sulfate 325 (65 FE) MG tablet Take 1 tablet (325 mg total) by mouth daily with breakfast.   levonorgestrel-ethinyl estradiol (AMETHYST) 90-20 MCG tablet Take 1 tablet by mouth daily.   No facility-administered encounter medications on file as of 05/09/2021.    ALLERGIES:  Allergies  Allergen Reactions   Codeine Nausea And Vomiting     PHYSICAL EXAM:  ECOG PERFORMANCE STATUS: 1 - Symptomatic but completely ambulatory  There were no vitals filed for this visit. There were no vitals filed for this visit. Physical Exam Constitutional:      Appearance: Normal appearance.  HENT:     Head: Normocephalic and atraumatic.     Mouth/Throat:     Mouth: Mucous membranes are moist.  Eyes:     Extraocular Movements: Extraocular movements intact.     Pupils: Pupils are equal, round, and reactive to light.  Cardiovascular:     Rate and Rhythm: Normal rate and regular rhythm.     Pulses: Normal pulses.     Heart sounds: Normal heart sounds.  Pulmonary:     Effort: Pulmonary effort is normal.     Breath sounds: Normal breath sounds.  Abdominal:     General: Bowel sounds are normal.     Palpations: Abdomen is soft.     Tenderness: There is no abdominal tenderness.  Musculoskeletal:        General: No swelling.     Right lower leg: No edema.     Left lower leg: No edema.  Lymphadenopathy:     Cervical: No cervical adenopathy.  Skin:    General: Skin is warm and dry.  Neurological:     General: No focal deficit present.     Mental Status: She is alert and oriented to person, place, and time.  Psychiatric:        Mood and Affect: Mood  normal.        Behavior: Behavior normal.     LABORATORY DATA:  I have reviewed the labs as listed.  CBC    Component Value Date/Time   WBC 6.8 05/02/2021 1014   RBC 3.29 (L) 05/02/2021 1014   RBC 3.31 (L) 05/02/2021 1014   HGB 10.7 (L) 05/02/2021 1014   HGB 11.1 04/19/2021 1639   HCT 34.4 (L) 05/02/2021 1014   HCT 32.7 (L) 04/19/2021 1639   PLT 295 05/02/2021 1014   PLT 339 04/19/2021 1639   MCV 104.6 (H) 05/02/2021 1014   MCV 97 04/19/2021 1639   MCH 32.5 05/02/2021 1014   MCHC  31.1 05/02/2021 1014   RDW 15.3 05/02/2021 1014   RDW 13.8 04/19/2021 1639   LYMPHSABS 1.2 05/02/2021 1014   LYMPHSABS 2.2 04/19/2021 1639   MONOABS 0.4 05/02/2021 1014   EOSABS 0.1 05/02/2021 1014   EOSABS 0.1 04/19/2021 1639   BASOSABS 0.0 05/02/2021 1014   BASOSABS 0.0 04/19/2021 1639   CMP Latest Ref Rng & Units 01/07/2021 09/21/2020 05/07/2017  Glucose 65 - 99 mg/dL 86 90 82  BUN 7 - 25 mg/dL 5(L) 9 8  Creatinine 0.50 - 0.97 mg/dL 0.72 0.63 0.48  Sodium 135 - 146 mmol/L 139 138 139  Potassium 3.5 - 5.3 mmol/L 4.4 3.7 3.3(L)  Chloride 98 - 110 mmol/L 105 104 106  CO2 20 - 32 mmol/L '24 24 22  '$ Calcium 8.6 - 10.2 mg/dL 9.5 9.9 8.8(L)  Total Protein 6.1 - 8.1 g/dL 7.6 8.1 7.6  Total Bilirubin 0.2 - 1.2 mg/dL 0.9 0.9 0.7  Alkaline Phos 38 - 126 U/L - 48 50  AST 10 - 30 U/L '13 18 19  '$ ALT 6 - 29 U/L 5(L) 9 12(L)    DIAGNOSTIC IMAGING:  I have independently reviewed the relevant imaging and discussed with the patient.  ASSESSMENT & PLAN: 1.  Iron deficiency anemia - Patient seen at the request of [name] for iron deficiency with labs from 01/07/2021 showing ferritin 6, hemoglobin 10.8 and MCV 88.6. - Menstrual cycles are heavy, but she recently started birth control to try to control this - She denies any bright red blood per rectum or melena  - No history of blood transfusions - She failed to improve with oral iron supplementation. - She received IV Feraheme x2 on 02/21/2021 on  03/08/2021 - Symptoms after IV iron were improved, but she has had progressive fatigue and recurrent ice pica since that time - Most recent labs (05/02/2021): Hgb 10.7/MCV 104.6.  Ferritin 148 with iron saturation 32%.  Vitamin B12 deficiency addressed below.  LDH and reticulocytes normal.  Normal folate and copper. - PLAN: Iron levels are at goal, no indication for IV iron at this time. - Persistent macrocytic anemia may be due to B12 deficiency.  B12 repletion as below. - Repeat labs in 2 months with phone visit to follow.    2.  Vitamin B12 deficiency - Labs checked by PCP (04/19/2021) show vitamin B12 low at 142 - She was started on vitamin B12 500 mcg daily by her PCP - PLAN: Continue vitamin B12 supplement.  We will check B12/methylmalonic acid in 2 months, followed by phone visit.     3.  Abdominal pain - She had initial visit with [name] in November 2022, but did not follow-up with him - She is advised to make an appointment to see [name] again and follow through with his recommendations for additional work-up (including EGD and CT abdomen). - She is advised to seek immediate medical attention if she has any severe abdominal pain associated with refractory nausea, vomiting, diarrhea or fever/chills  4.  Social/family history: - She lives at home with her husband.  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 - Sister had anemia.  No cancer in the family.  Her mother was adopted.   PLAN SUMMARY & DISPOSITION: Labs and phone visit in 2 months  All questions were answered. The patient knows to call the clinic with any problems, questions or concerns.  Medical decision making: Moderate  Time spent on visit: I spent 20 minutes counseling the patient  face to face. The total time spent in the appointment was 30 minutes and more than 50% was on counseling.   [name], PA-C  05/09/2021 4:21 PM

## 2021-05-09 ENCOUNTER — Inpatient Hospital Stay (HOSPITAL_COMMUNITY): Payer: Medicaid Other | Admitting: Physician Assistant

## 2021-05-09 ENCOUNTER — Other Ambulatory Visit: Payer: Self-pay

## 2021-05-09 VITALS — BP 97/69 | HR 94 | Temp 98.3°F | Resp 18 | Ht 61.81 in | Wt 113.3 lb

## 2021-05-09 DIAGNOSIS — D509 Iron deficiency anemia, unspecified: Secondary | ICD-10-CM

## 2021-05-09 DIAGNOSIS — R109 Unspecified abdominal pain: Secondary | ICD-10-CM | POA: Diagnosis not present

## 2021-05-09 DIAGNOSIS — E538 Deficiency of other specified B group vitamins: Secondary | ICD-10-CM | POA: Diagnosis not present

## 2021-05-09 NOTE — Patient Instructions (Signed)
[place] at [facility] ?Discharge Instructions ? ?You were seen today by [name] PA-C for your iron deficiency anemia.  Your iron levels are better, but your blood levels are still slightly low.  This may be form your recent blood loss from your heavy period.  It may also be from your low B12 levels. ? ?Continue taking iron and B12 daily. ? ?We will check your labs and follow up via phone visit in 2 months. ? ?** Make an appointment with [name] for further workup of your abdominal pain.   ? ? ? ?Thank you for choosing [place] at [facility] to provide your oncology and hematology care.  To afford each patient quality time with our provider, please arrive at least 15 minutes before your scheduled appointment time.  ? ?If you have a lab appointment with the [facility] please come in thru the Main Entrance and check in at the main information desk. ? ?You need to re-schedule your appointment should you arrive 10 or more minutes late.  We strive to give you quality time with our providers, and arriving late affects you and other patients whose appointments are after yours.  Also, if you no show three or more times for appointments you may be dismissed from the clinic at the providers discretion.     ?Again, thank you for choosing [facility].  Our hope is that these requests will decrease the amount of time that you wait before being seen by our physicians.       ?_____________________________________________________________ ? ?Should you have questions after your visit to [facility], please contact our office at [phone] and follow the prompts.  Our office hours are 8:00 a.m. and 4:30 p.m. Monday - Friday.  Please note that voicemails left after 4:00 p.m. may not be returned until the following business day.  We are closed weekends and major holidays.  You do have access to a nurse 24-7, just call the main number  to the clinic [phone] and do not press any options, hold on the line and a nurse will answer the phone.   ? ?For prescription refill requests, have your pharmacy contact our office and allow 72 hours.   ? ?Due to Covid, you will need to wear a mask upon entering the hospital. If you do not have a mask, a mask will be given to you at the Main Entrance upon arrival. For doctor visits, patients may have 1 support person age 57 or older with them. For treatment visits, patients can not have anyone with them due to social distancing guidelines and our immunocompromised population.  ? ? ? ?

## 2021-05-13 ENCOUNTER — Ambulatory Visit (INDEPENDENT_AMBULATORY_CARE_PROVIDER_SITE_OTHER): Payer: Medicaid Other | Admitting: Gastroenterology

## 2021-05-17 ENCOUNTER — Encounter: Payer: Self-pay | Admitting: Family

## 2021-05-17 ENCOUNTER — Ambulatory Visit: Payer: Medicaid Other | Admitting: Family

## 2021-05-17 VITALS — BP 106/67 | HR 75 | Temp 98.0°F | Ht 61.0 in | Wt 113.0 lb

## 2021-05-17 DIAGNOSIS — R1013 Epigastric pain: Secondary | ICD-10-CM

## 2021-05-17 MED ORDER — OMEPRAZOLE 20 MG PO CPDR: 20.0000 mg | DELAYED_RELEASE_CAPSULE | Freq: Every day | ORAL | 1 refills | Status: DC

## 2021-05-17 NOTE — Progress Notes (Signed)
? ?Subjective:  ? ? Patient ID: Vanessa Villanueva, female    DOB: Feb 02, 1991, 31 y.o.   MRN: [id number] ? ?Chief Complaint  ?Patient presents with  ? GI Problem  ?  Hematologist told her to go the [facility].  ? ?PT presents to the office today for referral to GI. She is followed by Oncologists who recommend referral to GI.  ?GI Problem ?The primary symptoms include abdominal pain and nausea. Primary symptoms do not include fever or diarrhea.  ?The illness does not include constipation.  ?Abdominal Pain ?This is a new problem. The current episode started more than 1 month ago. The onset quality is gradual. The pain is located in the epigastric region and LLQ. The pain is at a severity of 10/10. The pain is moderate. The quality of the pain is sharp. The abdominal pain does not radiate. Associated symptoms include belching and nausea. Pertinent negatives include no constipation, diarrhea, fever, frequency or hematuria. The pain is aggravated by eating. The pain is relieved by Being still. She has tried antacids for the symptoms. The treatment provided mild relief.  ? ? ? ?Review of Systems  ?Constitutional:  Negative for fever.  ?Gastrointestinal:  Positive for abdominal pain and nausea. Negative for constipation and diarrhea.  ?Genitourinary:  Negative for frequency and hematuria.  ?All other systems reviewed and are negative. ? ?   ?Objective:  ? Physical Exam ?Vitals reviewed.  ?Constitutional:   ?   General: She is not in acute distress. ?   Appearance: She is well-developed.  ?HENT:  ?   Head: Normocephalic and atraumatic.  ?   Right Ear: Tympanic membrane normal.  ?   Left Ear: Tympanic membrane normal.  ?Eyes:  ?   Pupils: Pupils are equal, round, and reactive to light.  ?Neck:  ?   Thyroid: No thyromegaly.  ?Cardiovascular:  ?   Rate and Rhythm: Normal rate and regular rhythm.  ?   Heart sounds: Normal heart sounds. No murmur heard. ?Pulmonary:  ?   Effort: Pulmonary effort is normal. No respiratory distress.  ?    Breath sounds: Normal breath sounds. No wheezing.  ?Abdominal:  ?   General: Bowel sounds are normal. There is no distension.  ?   Palpations: Abdomen is soft.  ?   Tenderness: There is no abdominal tenderness.  ?Musculoskeletal:     ?   General: No tenderness. Normal range of motion.  ?   Cervical back: Normal range of motion and neck supple.  ?Skin: ?   General: Skin is warm and dry.  ?Neurological:  ?   Mental Status: She is alert and oriented to person, place, and time.  ?   Cranial Nerves: No cranial nerve deficit.  ?   Deep Tendon Reflexes: Reflexes are normal and symmetric.  ?Psychiatric:     ?   Behavior: Behavior normal.     ?   Thought Content: Thought content normal.     ?   Judgment: Judgment normal.  ? ? ? ? ?BP 106/67   Pulse 75   Temp 98 ?F (36.7 ?C) (Temporal)   Ht '5\' 1"'$  (1.549 m)   Wt 113 lb (51.3 kg)   BMI 21.35 kg/m?  ? ?   ?Assessment & Plan:  ?Vanessa Villanueva comes in today with chief complaint of GI Problem (Hematologist told her to go the [facility].) ? ? ?Diagnosis and orders addressed: ? ?1. Epigastric pain ?Start Omeprazole ?Referral to GI ?Bland diet ?Avoid NSAID's  ?-  Ambulatory referral to Gastroenterology ?- H Pylori, IGM, IGG, IGA AB ?- omeprazole (PRILOSEC) 20 MG capsule; Take 1 capsule (20 mg total) by mouth daily.  Dispense: 90 capsule; Refill: 1 ? ? ? ?Evelina Dun, FNP ? ? ?

## 2021-05-17 NOTE — Patient Instructions (Signed)
Peptic Ulcer Eating Plan ?A peptic ulcer is a sore in the lining of the stomach (gastric ulcer) or the first part of the small intestine (duodenal ulcer). These sores are also called stomach ulcers. When ulcers develop, they can cause a burning feeling in the stomach as well as bloating, nausea, vomiting, and poor appetite. ?If you have a history of peptic ulcers, it is important to keep track of what foods and drinks cause symptoms. ?What are tips for following this plan? ? ?Eat a healthy, well-balanced diet. This includes: ?Fresh fruits and vegetables. Eat a variety of colors of fruits and vegetables. ?Whole grains. Try to make sure at least half of the grains you eat each day are whole grains. ?Low-fat dairy. ?Lean meat, fish, poultry, eggs, beans, and nuts. ?Healthy fats, such as olive oil, grapeseed oil, or canola oil. Try to eat less than 8 teaspoons of fats and oils each day. ?Avoid foods that cause irritation or pain. These may be different for different people. Keep a food diary to identify foods that cause symptoms. ?Avoid processed foods that have added salt and sugar. ?Avoid drinking alcohol. ?Avoid drinks with caffeine, such as cola, black tea, energy drinks, and coffee. ?Recommended foods ?Grains ?Whole grains. ?Vegetables ?All fresh or frozen vegetables. Low-sodium canned vegetables. ?Fruits ?All fresh, frozen, or dried fruit. Fruit canned in juice. ?Meats and other protein foods ?Lean cuts of meat. Skinless poultry. Fresh or canned fish. Eggs. Tofu. Nuts and nut butter. Dried beans. Low-sodium canned beans. ?Dairy ?Low-fat or nonfat (skim) milk. Nonfat or low-fat yogurt. Nonfat or low-fat cheese. ?Beverages ?Water. Soy or nut milks. Caffeine-free soft drinks. Herbal tea. ?Fats and oils ?Olive oil. Canola oil. Grapeseed oil. Sunflower oil. ?Seasoning and other foods ?Low-fat salad dressing. Ketchup. Low-fat mayonnaise. All spices except pepper. Low-sodium seasoning mixes. ?The items listed above may  not be a complete list of foods and beverages you can eat. Contact a dietitian for more information. ?Foods to avoid ?Meats and other protein foods ?Fatty meats. Fried meats. Any meat that causes symptoms. ?Dairy ?Whole milk. Ice cream. Cream. Chocolate milk. ?Beverages ?Alcohol. Coffee. Cola and energy drinks. Black or green tea. Cocoa. ?Fats and oils ?Butter. Lard. Ghee. ?Seasoning and other foods ?Pepper. Hot sauce. Any seasonings or condiments that cause symptoms. ?The items listed above may not be a complete list of foods and beverages that you should avoid. Contact a dietitian for more information. ?Summary ?Peptic ulcers can cause burning in the stomach as well as bloating, nausea, vomiting, and poor appetite. You may be able to limit symptoms by avoiding foods that make you feel worse. ?Work with your dietitian or health care provider to identify foods that cause symptoms. This may include caffeinated drinks, alcohol, or pepper. ?This information is not intended to replace advice given to you by your health care provider. Make sure you discuss any questions you have with your health care provider. ?Document Revised: 09/28/2020 Document Reviewed: 09/28/2020 ?Elsevier Patient Education ? [place]. ? ?

## 2021-05-20 LAB — H PYLORI, IGM, IGG, IGA AB
H pylori, IgM Abs: <9 units (ref 0.0–8.9)
H. pylori, IgA Abs: <9 units (ref 0.0–8.9)
H. pylori, IgG AbS: 0.11 Index Value (ref 0.00–0.79)

## 2021-05-28 ENCOUNTER — Encounter (INDEPENDENT_AMBULATORY_CARE_PROVIDER_SITE_OTHER): Payer: Self-pay | Admitting: *Deleted

## 2021-06-24 ENCOUNTER — Ambulatory Visit (INDEPENDENT_AMBULATORY_CARE_PROVIDER_SITE_OTHER): Payer: Medicaid Other | Admitting: Gastroenterology

## 2021-06-24 ENCOUNTER — Encounter (INDEPENDENT_AMBULATORY_CARE_PROVIDER_SITE_OTHER): Payer: Self-pay | Admitting: Gastroenterology

## 2021-07-03 ENCOUNTER — Ambulatory Visit (INDEPENDENT_AMBULATORY_CARE_PROVIDER_SITE_OTHER): Payer: Medicaid Other

## 2021-07-03 ENCOUNTER — Encounter: Payer: Self-pay | Admitting: Nurse Practitioner

## 2021-07-03 ENCOUNTER — Ambulatory Visit: Payer: Medicaid Other | Admitting: Nurse Practitioner

## 2021-07-03 VITALS — BP 110/75 | HR 74 | Temp 97.9°F | Resp 20 | Ht 61.0 in | Wt 118.0 lb

## 2021-07-03 DIAGNOSIS — R0789 Other chest pain: Secondary | ICD-10-CM

## 2021-07-03 DIAGNOSIS — R079 Chest pain, unspecified: Secondary | ICD-10-CM | POA: Diagnosis not present

## 2021-07-03 MED ORDER — ACETAMINOPHEN 500 MG PO TABS: 500.0000 mg | ORAL_TABLET | Freq: Four times a day (QID) | ORAL | 0 refills | Status: DC | PRN

## 2021-07-03 NOTE — Patient Instructions (Signed)
   Chest Wall Pain Chest wall pain is pain in or around the bones and muscles of your chest. Chest wall pain may be caused by: An injury. Coughing a lot. Using your chest and arm muscles too much. Sometimes, the cause may not be known. This pain may take a few weeks or longer to get better. Follow these instructions at home: Managing pain, stiffness, and swelling If told, put ice on the painful area: Put ice in a plastic bag. Place a towel between your skin and the bag. Leave the ice on for 20 minutes, 2-3 times a day.  Activity Rest as told by your doctor. Avoid doing things that cause pain. This includes lifting heavy items. Ask your doctor what activities are safe for you. General instructions  Take over-the-counter and prescription medicines only as told by your doctor. Do not use any products that contain nicotine or tobacco, such as cigarettes, e-cigarettes, and chewing tobacco. If you need help quitting, ask your doctor. Keep all follow-up visits as told by your doctor. This is important. Contact a doctor if: You have a fever. Your chest pain gets worse. You have new symptoms. Get help right away if: You feel sick to your stomach (nauseous) or you throw up (vomit). You feel sweaty or light-headed. You have a cough with mucus from your lungs (sputum) or you cough up blood. You are short of breath. These symptoms may be an emergency. Do not wait to see if the symptoms will go away. Get medical help right away. Call your local emergency services (911 in the [place] Do not drive yourself to the hospital. Summary Chest wall pain is pain in or around the bones and muscles of your chest. It may be treated with ice, rest, and medicines. Your condition may also get better if you avoid doing things that cause pain. Contact a doctor if you have a fever, chest pain that gets worse, or new symptoms. Get help right away if you feel light-headed or you get short of breath. These symptoms  may be an emergency. This information is not intended to replace advice given to you by your health care provider. Make sure you discuss any questions you have with your health care provider. Document Revised: 05/22/2020 Document Reviewed: 05/04/2020 Elsevier Patient Education  2023 Elsevier Inc.  

## 2021-07-03 NOTE — Progress Notes (Signed)
? ?Acute Office Visit ? ?Subjective:  ? ?  ?Patient ID: Vanessa Villanueva, female    DOB: 1990/04/08, 31 y.o.   MRN: [id number] ? ?Chief Complaint  ?Patient presents with  ? knot on chest   ?  Left upper chest area - painful at times -noticed 3 weeks ago   ? ? ?HPI ?Patient is in today for  ? ?Review of Systems  ?Constitutional: Negative.   ?HENT: Negative.    ?Eyes: Negative.   ?Respiratory: Negative.    ?Cardiovascular: Negative.   ?Genitourinary: Negative.   ?Skin: Negative.   ?All other systems reviewed and are negative. ? ? ?Pain ? ?She reports new onset right chest wall pain. was not an injury that may have caused the pain. The pain started  3 weeks ago and is staying constant. The pain does not radiate . The pain is described as aching and soreness, is severe in intensity, occurring constantly. Symptoms are worse in the: morning, mid-day, afternoon, evening  ?Aggravating factors: palpation  Relieving factors: none.  ?She has tried application of heat and NSAIDs with little relief.  ? ? ?   ?Objective:  ?  ?BP 110/75   Pulse 74   Temp 97.9 ?F (36.6 ?C)   Resp 20   Ht '5\' 1"'$  (1.549 m)   Wt 118 lb (53.5 kg)   LMP 04/17/2021 (Approximate) Comment: irregular  SpO2 100%   BMI 22.30 kg/m?  ?BP Readings from Last 3 Encounters:  ?07/03/21 110/75  ?05/17/21 106/67  ?05/09/21 97/69  ? ?Wt Readings from Last 3 Encounters:  ?07/03/21 118 lb (53.5 kg)  ?05/17/21 113 lb (51.3 kg)  ?05/09/21 113 lb 5.1 oz (51.4 kg)  ? ?  ? ?Physical Exam ?Vitals and nursing note reviewed.  ?Constitutional:   ?   Appearance: Normal appearance.  ?HENT:  ?   Head: Normocephalic.  ?   Right Ear: External ear normal.  ?   Left Ear: External ear normal.  ?   Nose: Nose normal.  ?   Mouth/Throat:  ?   Mouth: Mucous membranes are moist.  ?   Pharynx: Oropharynx is clear.  ?Eyes:  ?   Conjunctiva/sclera: Conjunctivae normal.  ?Cardiovascular:  ?   Rate and Rhythm: Normal rate and regular rhythm.  ?   Pulses: Normal pulses.  ?   Heart sounds:  Normal heart sounds.  ?Pulmonary:  ?   Effort: Pulmonary effort is normal.  ?   Breath sounds: Normal breath sounds.  ?Abdominal:  ?   General: Bowel sounds are normal.  ?Musculoskeletal:     ?   General: Tenderness present.  ?     Arms: ? ?   Comments: Palpable right upper chest wall nodule with pain/soreness  ?Skin: ?   Findings: No rash.  ?Neurological:  ?   Mental Status: She is alert and oriented to person, place, and time.  ?Psychiatric:     ?   Behavior: Behavior normal.  ? ? ?No results found for any visits on 07/03/21. ? ? ?   ?Assessment & Plan:  ?Pain not well controlled, use warm compress as needed, tylenol, completed x-ray results pending. Patient may need ultrasound is x-ray comes up with no definite conclusions.  ?Problem List Items Addressed This Visit   ?None ?Visit Diagnoses   ? ? Chest wall pain    -  Primary  ? Relevant Medications  ? acetaminophen (TYLENOL) 500 MG tablet  ? Other Relevant Orders  ? DG Chest 2  View  ? ?  ? ? ?Meds ordered this encounter  ?Medications  ? acetaminophen (TYLENOL) 500 MG tablet  ?  Sig: Take 1 tablet (500 mg total) by mouth every 6 (six) hours as needed.  ?  Dispense:  30 tablet  ?  Refill:  0  ?  Order Specific Question:   Supervising Provider  ?  AnswerClaretta Fraise [[id number]]  ? ? ?Return if symptoms worsen or fail to improve. ? ?Ivy Lynn, NP ? ? ?

## 2021-07-04 ENCOUNTER — Inpatient Hospital Stay (HOSPITAL_COMMUNITY): Payer: Medicaid Other | Attending: Hematology

## 2021-07-04 DIAGNOSIS — D509 Iron deficiency anemia, unspecified: Secondary | ICD-10-CM | POA: Insufficient documentation

## 2021-07-04 DIAGNOSIS — E538 Deficiency of other specified B group vitamins: Secondary | ICD-10-CM | POA: Insufficient documentation

## 2021-07-04 LAB — CBC WITH DIFFERENTIAL/PLATELET
Abs Immature Granulocytes: 0.01 10*3/uL (ref 0.00–0.07)
Basophils Absolute: 0 10*3/uL (ref 0.0–0.1)
Basophils Relative: 1 %
Eosinophils Absolute: 0.1 10*3/uL (ref 0.0–0.5)
Eosinophils Relative: 2 %
HCT: 35.2 % — ABNORMAL LOW (ref 36.0–46.0)
Hemoglobin: 11 g/dL — ABNORMAL LOW (ref 12.0–15.0)
Immature Granulocytes: 0 %
Lymphocytes Relative: 22 %
Lymphs Abs: 1.2 10*3/uL (ref 0.7–4.0)
MCH: 32.4 pg (ref 26.0–34.0)
MCHC: 31.3 g/dL (ref 30.0–36.0)
MCV: 103.5 fL — ABNORMAL HIGH (ref 80.0–100.0)
Monocytes Absolute: 0.3 10*3/uL (ref 0.1–1.0)
Monocytes Relative: 6 %
Neutro Abs: 3.8 10*3/uL (ref 1.7–7.7)
Neutrophils Relative %: 69 %
Platelets: 303 10*3/uL (ref 150–400)
RBC: 3.4 MIL/uL — ABNORMAL LOW (ref 3.87–5.11)
RDW: 12.4 % (ref 11.5–15.5)
WBC: 5.4 10*3/uL (ref 4.0–10.5)
nRBC: 0 % (ref 0.0–0.2)

## 2021-07-04 LAB — IRON AND TIBC
Iron: 93 ug/dL (ref 28–170)
Saturation Ratios: 25 % (ref 10.4–31.8)
TIBC: 377 ug/dL (ref 250–450)
UIBC: 284 ug/dL

## 2021-07-04 LAB — VITAMIN B12: Vitamin B-12: 210 pg/mL (ref 180–914)

## 2021-07-04 LAB — FERRITIN: Ferritin: 78 ng/mL (ref 11–307)

## 2021-07-05 ENCOUNTER — Other Ambulatory Visit: Payer: Self-pay | Admitting: Nurse Practitioner

## 2021-07-05 DIAGNOSIS — R0789 Other chest pain: Secondary | ICD-10-CM

## 2021-07-05 NOTE — Progress Notes (Signed)
US completed

## 2021-07-08 LAB — METHYLMALONIC ACID, SERUM: Methylmalonic Acid, Quantitative: 122 nmol/L (ref 0–378)

## 2021-07-10 DIAGNOSIS — N200 Calculus of kidney: Secondary | ICD-10-CM | POA: Diagnosis not present

## 2021-07-10 DIAGNOSIS — K8020 Calculus of gallbladder without cholecystitis without obstruction: Secondary | ICD-10-CM | POA: Diagnosis not present

## 2021-07-10 DIAGNOSIS — R079 Chest pain, unspecified: Secondary | ICD-10-CM | POA: Diagnosis not present

## 2021-07-10 DIAGNOSIS — E876 Hypokalemia: Secondary | ICD-10-CM | POA: Diagnosis not present

## 2021-07-11 ENCOUNTER — Telehealth: Payer: Self-pay

## 2021-07-11 NOTE — Progress Notes (Signed)
? ?Virtual Visit via Telephone Note ?[place] ? ?I connected with Vanessa Villanueva  on 07/12/21 at  2:45 PM by telephone and verified that I am speaking with the correct person using two identifiers. ? ?Location: ?Patient: Home ?Provider: [place] ?  ?I discussed the limitations, risks, security and privacy concerns of performing an evaluation and management service by telephone and the availability of in person appointments. I also discussed with the patient that there may be a patient responsible charge related to this service. The patient expressed understanding and agreed to proceed. ? ?REASON FOR VISIT:  ?Follow-up for iron deficiency anemia ?  ?PRIOR THERAPY: Iron tablets ?  ?CURRENT THERAPY: IV Feraheme (02/21/2021 and 03/08/2021) ? ?INTERVAL HISTORY: ?Vanessa Villanueva is contacted today for follow-up of her iron deficiency anemia.  She was last seen 05/09/2021 by [name] PA-C. ?At today's visit, she reports feeling somewhat better since our last visit.  She reports that she has not had a period since she started birth control in February 2023 and has noticed some improved energy and decreased headaches since that time.  She denies any pica, restless legs, chest pain, dyspnea on exertion, lightheadedness, or syncope.  She is taking iron tablets and B12 tablets daily.  She reports 50% energy and 40% appetite.  She is still having some issues with intermittent abdominal pain suspected to be from her gallbladder, and has been referred for cholecystectomy. ? ?  ?OBSERVATIONS/OBJECTIVE: ?Review of Systems  ?Constitutional:  Negative for chills, diaphoresis, fever and weight loss.  ?Respiratory:  Negative for cough and shortness of breath.   ?Cardiovascular:  Negative for chest pain and palpitations.  ?Gastrointestinal:  Positive for nausea. Negative for abdominal pain, blood in stool, melena and vomiting.  ?Neurological:  Positive for headaches. Negative for  dizziness.  ?Psychiatric/Behavioral:  The patient has insomnia.    ? ?PHYSICAL EXAM (per limitations of virtual telephone visit): The patient is alert and oriented x 3, exhibiting adequate mentation, good mood, and ability to speak in full sentences and execute sound judgement. ? ? ?ASSESSMENT & PLAN: ?1.  Iron deficiency anemia ?- Patient seen at the request of [name] for iron deficiency with labs from 01/07/2021 showing ferritin 6, hemoglobin 10.8 and MCV 88.6. ?- Menstrual cycles used to be heavy, but she has not had a period since she started birth control in February 2023 ?- She denies any bright red blood per rectum or melena  ?- No history of blood transfusions ?- She failed to improve with oral iron supplementation. ?- She received IV Feraheme x2 on 02/21/2021 on 03/08/2021 ?- Symptoms after IV iron were improved, and she feels that she is continuing to improve on oral iron ?- Most recent labs (07/04/2021): Ferritin 78, iron saturation 25%.  Vitamin B12 deficiency addressed below.  LDH and reticulocytes normal.  Normal folate and copper. ?- CBC (07/10/2021) improved with Hgb 11.3/MCV 102.3 ?- PLAN: We will continue with oral iron supplementation for the time being, since she is no longer having menorrhagia. ?- Persistent macrocytic anemia may be due to B12 deficiency.  B12 repletion as below. ?- Repeat labs in 4 months with phone visit to follow.   ?  ?2.  Vitamin B12 deficiency ?- Labs checked by PCP (04/19/2021) show vitamin B12 low at 142 ?- She was started on vitamin B12 500 mcg daily by her PCP ?- Most recent labs (07/04/2021) show improved but marginal vitamin B12 at 210 ?- PLAN: Recommend  increasing B12 to 1,000 mcg daily.  ?- We will check B12/methylmalonic acid in 4 months, followed by phone visit.    ? ?3.  Social/family history: ?- She lives at home with her husband.  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 ?- Sister had anemia.  No cancer in the family.  Her mother was  adopted. ? ? ?FOLLOW UP INSTRUCTIONS: ?Labs in 4 months ?Phone visit after labs ? ?  ?I discussed the assessment and treatment plan with the patient. The patient was provided an opportunity to ask questions and all were answered. The patient agreed with the plan and demonstrated an understanding of the instructions. ?  ?The patient was advised to call back or seek an in-person evaluation if the symptoms worsen or if the condition fails to improve as anticipated. ? ?I provided 15 minutes of non-face-to-face time during this encounter. ? ? ?[name], PA-C ?07/12/2021 2:59 PM ?

## 2021-07-11 NOTE — Telephone Encounter (Signed)
Transition Care Management Unsuccessful Follow-up Telephone Call ? ?Date of discharge and from where:  07/10/2021 from [facility] ? ?Attempts:  1st Attempt ? ?Reason for unsuccessful TCM follow-up call:  Left voice message ? ? ? ?

## 2021-07-12 ENCOUNTER — Inpatient Hospital Stay (HOSPITAL_BASED_OUTPATIENT_CLINIC_OR_DEPARTMENT_OTHER): Payer: Medicaid Other | Admitting: Physician Assistant

## 2021-07-12 DIAGNOSIS — E538 Deficiency of other specified B group vitamins: Secondary | ICD-10-CM

## 2021-07-12 DIAGNOSIS — D509 Iron deficiency anemia, unspecified: Secondary | ICD-10-CM

## 2021-07-12 NOTE — Telephone Encounter (Signed)
Transition Care Management Follow-up Telephone Call ?Date of discharge and from where: 07/10/2021 from [facility] ?How have you been since you were released from the hospital? Patient stated that she is feeling some better and did not have any questions or concerns at this time.  ?Any questions or concerns? No ? ?Items Reviewed: ?Did the pt receive and understand the discharge instructions provided? Yes  ?Medications obtained and verified? Yes  ?Other? No  ?Any new allergies since your discharge? No  ?Dietary orders reviewed? No ?Do you have support at home? Yes  ? ?Functional Questionnaire: (I = Independent and D = Dependent) ?ADLs: I ? ?Bathing/Dressing- I ? ?Meal Prep- I ? ?Eating- I ? ?Maintaining continence- I ? ?Transferring/Ambulation- I ? ?Managing Meds- I ? ? ?Follow up appointments reviewed: ? ?PCP Hospital f/u appt confirmed? Yes  Scheduled to see Evelina Dun, FNP on 07/30/2021 @ 9:25 am. ?[facility] f/u appt confirmed? No   ?Are transportation arrangements needed? No  ?If their condition worsens, is the pt aware to call PCP or go to the Emergency Dept.? Yes ?Was the patient provided with contact information for the PCP's office or ED? Yes ?Was to pt encouraged to call back with questions or concerns? Yes ? ?

## 2021-07-17 DIAGNOSIS — K8050 Calculus of bile duct without cholangitis or cholecystitis without obstruction: Secondary | ICD-10-CM | POA: Diagnosis not present

## 2021-07-19 DIAGNOSIS — Z01818 Encounter for other preprocedural examination: Secondary | ICD-10-CM | POA: Diagnosis not present

## 2021-07-22 DIAGNOSIS — K8000 Calculus of gallbladder with acute cholecystitis without obstruction: Secondary | ICD-10-CM | POA: Diagnosis not present

## 2021-07-22 DIAGNOSIS — Z9049 Acquired absence of other specified parts of digestive tract: Secondary | ICD-10-CM | POA: Diagnosis not present

## 2021-07-22 DIAGNOSIS — K8010 Calculus of gallbladder with chronic cholecystitis without obstruction: Secondary | ICD-10-CM | POA: Diagnosis not present

## 2021-07-24 ENCOUNTER — Ambulatory Visit (HOSPITAL_COMMUNITY): Admission: RE | Admit: 2021-07-24 | Payer: Medicaid Other | Source: Ambulatory Visit

## 2021-07-30 ENCOUNTER — Encounter: Payer: Self-pay | Admitting: Family

## 2021-07-30 ENCOUNTER — Ambulatory Visit: Payer: Medicaid Other | Admitting: Family

## 2021-07-30 VITALS — BP 99/62 | HR 94 | Temp 98.2°F | Ht 61.0 in | Wt 111.5 lb

## 2021-07-30 DIAGNOSIS — D1771 Benign lipomatous neoplasm of kidney: Secondary | ICD-10-CM

## 2021-07-30 DIAGNOSIS — E876 Hypokalemia: Secondary | ICD-10-CM | POA: Diagnosis not present

## 2021-07-30 DIAGNOSIS — Z9049 Acquired absence of other specified parts of digestive tract: Secondary | ICD-10-CM | POA: Diagnosis not present

## 2021-07-30 DIAGNOSIS — Z09 Encounter for follow-up examination after completed treatment for conditions other than malignant neoplasm: Secondary | ICD-10-CM | POA: Diagnosis not present

## 2021-07-30 LAB — CBC WITH DIFFERENTIAL/PLATELET
Basophils Absolute: 0 10*3/uL (ref 0.0–0.2)
Basos: 0 %
EOS (ABSOLUTE): 0.3 10*3/uL (ref 0.0–0.4)
Eos: 3 %
Hematocrit: 38.6 % (ref 34.0–46.6)
Hemoglobin: 13 g/dL (ref 11.1–15.9)
Immature Grans (Abs): 0 10*3/uL (ref 0.0–0.1)
Immature Granulocytes: 0 %
Lymphocytes Absolute: 1.9 10*3/uL (ref 0.7–3.1)
Lymphs: 21 %
MCH: 32.5 pg (ref 26.6–33.0)
MCHC: 33.7 g/dL (ref 31.5–35.7)
MCV: 97 fL (ref 79–97)
Monocytes Absolute: 0.7 10*3/uL (ref 0.1–0.9)
Monocytes: 8 %
Neutrophils Absolute: 6.1 10*3/uL (ref 1.4–7.0)
Neutrophils: 68 %
Platelets: 397 10*3/uL (ref 150–450)
RBC: 4 x10E6/uL (ref 3.77–5.28)
RDW: 11.1 % — ABNORMAL LOW (ref 11.7–15.4)
WBC: 9 10*3/uL (ref 3.4–10.8)

## 2021-07-30 LAB — CMP14+EGFR
ALT: 46 IU/L — ABNORMAL HIGH (ref 0–32)
AST: 26 IU/L (ref 0–40)
Albumin/Globulin Ratio: 1.7 (ref 1.2–2.2)
Albumin: 4.5 g/dL (ref 3.8–4.8)
Alkaline Phosphatase: 89 IU/L (ref 44–121)
BUN/Creatinine Ratio: 12 (ref 9–23)
BUN: 10 mg/dL (ref 6–20)
Bilirubin Total: 1.3 mg/dL — ABNORMAL HIGH (ref 0.0–1.2)
CO2: 20 mmol/L (ref 20–29)
Calcium: 9.7 mg/dL (ref 8.7–10.2)
Chloride: 100 mmol/L (ref 96–106)
Creatinine, Ser: 0.83 mg/dL (ref 0.57–1.00)
Globulin, Total: 2.6 g/dL (ref 1.5–4.5)
Glucose: 78 mg/dL (ref 70–99)
Potassium: 4.5 mmol/L (ref 3.5–5.2)
Sodium: 138 mmol/L (ref 134–144)
Total Protein: 7.1 g/dL (ref 6.0–8.5)
eGFR: 95 mL/min/{1.73_m2} (ref >59)

## 2021-07-30 NOTE — Patient Instructions (Signed)
Hypokalemia Hypokalemia means that the amount of potassium in the blood is lower than normal. Potassium is a mineral (electrolyte) that helps regulate the amount of fluid in the body. It also stimulates muscle tightening (contraction) and helps nerves work properly. Normally, most of the body's potassium is inside cells, and only a very small amount is in the blood. Because the amount in the blood is so small, minor changes to potassium levels in the blood can be life-threatening. What are the causes? This condition may be caused by: Antibiotic medicine. Diarrhea or vomiting. Taking too much of a medicine that helps you have a bowel movement (laxative) can cause diarrhea and lead to hypokalemia. Chronic kidney disease (CKD). Medicines that help the body get rid of excess fluid (diuretics). Eating disorders, such as anorexia or bulimia. Low magnesium levels in the body. Sweating a lot. What are the signs or symptoms? Symptoms of this condition include: Weakness. Constipation. Fatigue. Muscle cramps. Mental confusion. Skipped heartbeats or irregular heartbeat (palpitations). Tingling or numbness. How is this diagnosed? This condition is diagnosed with a blood test. How is this treated? This condition may be treated by: Taking potassium supplements. Adjusting the medicines that you take. Eating more foods that contain a lot of potassium. If your potassium level is very low, you may need to get potassium through an IV and be monitored in the hospital. Follow these instructions at home: Eating and drinking  Eat a healthy diet. A healthy diet includes fresh fruits and vegetables, whole grains, healthy fats, and lean proteins. If told, eat more foods that contain a lot of potassium. These include: Nuts, such as peanuts and pistachios. Seeds, such as sunflower seeds and pumpkin seeds. Peas, lentils, and lima beans. Whole grain and bran cereals and breads. Fresh fruits and vegetables,  such as apricots, avocado, bananas, cantaloupe, kiwi, oranges, tomatoes, asparagus, and potatoes. Juices, such as orange, tomato, and prune. Lean meats, including fish. Milk and milk products, such as yogurt. General instructions Take over-the-counter and prescription medicines only as told by your health care provider. This includes vitamins, natural food products, and supplements. Keep all follow-up visits. This is important. Contact a health care provider if: You have weakness that gets worse. You feel your heart pounding or racing. You vomit. You have diarrhea. You have diabetes and you have trouble keeping your blood sugar in your target range. Get help right away if: You have chest pain. You have shortness of breath. You have vomiting or diarrhea that lasts for more than 2 days. You faint. These symptoms may be an emergency. Get help right away. Call 911. Do not wait to see if the symptoms will go away. Do not drive yourself to the hospital. Summary Hypokalemia means that the amount of potassium in the blood is lower than normal. This condition is diagnosed with a blood test. Hypokalemia may be treated by taking potassium supplements, adjusting the medicines that you take, or eating more foods that are high in potassium. If your potassium level is very low, you may need to get potassium through an IV and be monitored in the hospital. This information is not intended to replace advice given to you by your health care provider. Make sure you discuss any questions you have with your health care provider. Document Revised: 11/01/2020 Document Reviewed: 11/01/2020 Elsevier Patient Education  2023 Elsevier Inc.  

## 2021-07-30 NOTE — Progress Notes (Signed)
Subjective:    Patient ID: Vanessa Villanueva, female    DOB: 1990/08/16, 31 y.o.   MRN: [id number]  No chief complaint on file.   HPI Pt presents to the office today for hospital follow up. She went to the ED on 07/10/21 with RUQ abdominal pain.  She had cholecystomy on 07/22/21.   While in the ED she her US abdomen showed, "1. New cholelithiasis.  2. Punctate left nephrolithiasis.  3. 2 mm round echogenic focus within the right kidney without  shadowing or twinkle artifact, nonspecific, possibly a tiny  angiomyolipoma. "  She is worried about the angiomyolipoma.   Her potassium was 3.1. She was given potassium and oral.   Her abdominal improved since her surgery.    Review of Systems  All other systems reviewed and are negative.     Objective:   Physical Exam Vitals reviewed.  Constitutional:      General: She is not in acute distress.    Appearance: She is well-developed.  HENT:     Head: Normocephalic and atraumatic.     Right Ear: Tympanic membrane normal.     Left Ear: Tympanic membrane normal.  Eyes:     Pupils: Pupils are equal, round, and reactive to light.  Neck:     Thyroid: No thyromegaly.  Cardiovascular:     Rate and Rhythm: Normal rate and regular rhythm.     Heart sounds: Normal heart sounds. No murmur heard. Pulmonary:     Effort: Pulmonary effort is normal. No respiratory distress.     Breath sounds: Normal breath sounds. No wheezing.  Abdominal:     General: Bowel sounds are normal. There is no distension.     Palpations: Abdomen is soft.     Tenderness: There is no abdominal tenderness.  Musculoskeletal:        General: No tenderness. Normal range of motion.     Cervical back: Normal range of motion and neck supple.  Skin:    General: Skin is warm and dry.     Comments: Trochar sites clean, no redness or warmth noted. Mild bruising present.   Neurological:     Mental Status: She is alert and oriented to person, place, and time.      Cranial Nerves: No cranial nerve deficit.     Deep Tendon Reflexes: Reflexes are normal and symmetric.  Psychiatric:        Behavior: Behavior normal.        Thought Content: Thought content normal.        Judgment: Judgment normal.    BP 99/62   Pulse 94   Temp 98.2 F (36.8 C) (Temporal)   Ht 5' 1"  (1.549 m)   Wt 111 lb 8 oz (50.6 kg)   SpO2 99%   BMI 21.07 kg/m       Assessment & Plan:  Vanessa Villanueva comes in today with chief complaint of No chief complaint on file.   Diagnosis and orders addressed:  1. Hypokalemia Labs pending  If still low will send in oral  - CMP14+EGFR - CBC with Differential/Platelet  2. Hospital discharge follow-up - CMP14+EGFR - CBC with Differential/Platelet  3. 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 Currently 2 mm. Will plan to repeat imaging next year and if increases will do referral.  - CMP14+EGFR - CBC with Differential/Platelet   Labs pending Health Maintenance reviewed Diet and exercise encouraged  Follow up plan: Keep chronic follow up.    Evelina Dun, FNP

## 2021-08-10 DIAGNOSIS — R070 Pain in throat: Secondary | ICD-10-CM | POA: Diagnosis not present

## 2021-08-10 DIAGNOSIS — F17200 Nicotine dependence, unspecified, uncomplicated: Secondary | ICD-10-CM | POA: Diagnosis not present

## 2021-08-10 DIAGNOSIS — J019 Acute sinusitis, unspecified: Secondary | ICD-10-CM | POA: Diagnosis not present

## 2021-08-10 DIAGNOSIS — B9689 Other specified bacterial agents as the cause of diseases classified elsewhere: Secondary | ICD-10-CM | POA: Diagnosis not present

## 2021-08-10 DIAGNOSIS — J988 Other specified respiratory disorders: Secondary | ICD-10-CM | POA: Diagnosis not present

## 2021-08-12 ENCOUNTER — Encounter: Payer: Self-pay | Admitting: Family Medicine

## 2021-08-12 ENCOUNTER — Ambulatory Visit (INDEPENDENT_AMBULATORY_CARE_PROVIDER_SITE_OTHER): Payer: Medicaid Other | Admitting: Family Medicine

## 2021-08-12 DIAGNOSIS — R062 Wheezing: Secondary | ICD-10-CM | POA: Diagnosis not present

## 2021-08-12 DIAGNOSIS — J069 Acute upper respiratory infection, unspecified: Secondary | ICD-10-CM

## 2021-08-12 MED ORDER — PREDNISONE 20 MG PO TABS: 40.0000 mg | ORAL_TABLET | Freq: Every day | ORAL | 0 refills | Status: AC

## 2021-08-12 MED ORDER — PSEUDOEPH-BROMPHEN-DM 30-2-10 MG/5ML PO SYRP: 5.0000 mL | ORAL_SOLUTION | Freq: Four times a day (QID) | ORAL | 0 refills | Status: DC | PRN

## 2021-08-12 NOTE — Progress Notes (Signed)
   Virtual Visit  Note Due to COVID-19 pandemic this visit was conducted virtually. This visit type was conducted due to national recommendations for restrictions regarding the COVID-19 Pandemic (e.g. social distancing, sheltering in place) in an effort to limit this patient's exposure and mitigate transmission in our community. All issues noted in this document were discussed and addressed.  A physical exam was not performed with this format.  I connected with Vanessa Villanueva on 08/12/21 at 1530 by telephone and verified that I am speaking with the correct person using two identifiers. Vanessa Villanueva is currently located at home and no one is currently with her during the visit. The provider, Gwenlyn Perking, FNP is located in their office at time of visit.  I discussed the limitations, risks, security and privacy concerns of performing an evaluation and management service by telephone and the availability of in person appointments. I also discussed with the patient that there may be a patient responsible charge related to this service. The patient expressed understanding and agreed to proceed.  CC: cough  History and Present Illness:  HPI Vanessa Villanueva reports sore throat, cough, and congestion x 6 days. She was seen at [facility] on [facility]/10/21 and given augmentin for bronchitis. She reports body aches as well. Has wheezing with coughing. She woke up with loss of taste and smell this morning. Her work is requiring her to get a Covid test today. She denies fever, chills, chest pain, or shortness of breath. She has been taking augmentin and theraflu without improvement.     ROS As per HPI.   Observations/Objective: Alert and oriented x 3. Able to speak in full sentences without difficulty.   Assessment and Plan: Vanessa Villanueva was seen today for cough.  Diagnoses and all orders for this visit:  Viral URI with cough Covid test pending, quarantine until results. Bromphed as below. Will send  prednisone due to wheezing. Work note provided. Discussed symptomatic care and return precautions.  -     COVID-19, Flu A+B and RSV; Future -     brompheniramine-pseudoephedrine-DM 30-2-10 MG/5ML syrup; Take 5 mLs by mouth 4 (four) times daily as needed. -     predniSONE (DELTASONE) 20 MG tablet; Take 2 tablets (40 mg total) by mouth daily with breakfast for 5 days.  Wheezing -     predniSONE (DELTASONE) 20 MG tablet; Take 2 tablets (40 mg total) by mouth daily with breakfast for 5 days.     Follow Up Instructions: Return to office for new or worsening symptoms, or if symptoms persist.     I discussed the assessment and treatment plan with the patient. The patient was provided an opportunity to ask questions and all were answered. The patient agreed with the plan and demonstrated an understanding of the instructions.   The patient was advised to call back or seek an in-person evaluation if the symptoms worsen or if the condition fails to improve as anticipated.  The above assessment and management plan was discussed with the patient. The patient verbalized understanding of and has agreed to the management plan. Patient is aware to call the clinic if symptoms persist or worsen. Patient is aware when to return to the clinic for a follow-up visit. Patient educated on when it is appropriate to go to the emergency department.   Time call ended: 1543   I provided 13 minutes of  non face-to-face time during this encounter.    Gwenlyn Perking, FNP

## 2021-08-13 LAB — COVID-19, FLU A+B AND RSV
Influenza A, NAA: NOT DETECTED
Influenza B, NAA: NOT DETECTED
RSV, NAA: NOT DETECTED
SARS-CoV-2, NAA: NOT DETECTED

## 2021-08-31 DIAGNOSIS — J040 Acute laryngitis: Secondary | ICD-10-CM | POA: Diagnosis not present

## 2021-08-31 DIAGNOSIS — R070 Pain in throat: Secondary | ICD-10-CM | POA: Diagnosis not present

## 2021-09-21 ENCOUNTER — Other Ambulatory Visit: Payer: Self-pay | Admitting: Family Medicine

## 2021-09-21 ENCOUNTER — Other Ambulatory Visit: Payer: Self-pay | Admitting: Family

## 2021-09-21 DIAGNOSIS — R1013 Epigastric pain: Secondary | ICD-10-CM

## 2021-09-21 DIAGNOSIS — Z30011 Encounter for initial prescription of contraceptive pills: Secondary | ICD-10-CM

## 2021-11-08 ENCOUNTER — Inpatient Hospital Stay: Payer: Medicaid Other | Attending: Physician Assistant

## 2021-11-08 DIAGNOSIS — E538 Deficiency of other specified B group vitamins: Secondary | ICD-10-CM | POA: Insufficient documentation

## 2021-11-08 DIAGNOSIS — D509 Iron deficiency anemia, unspecified: Secondary | ICD-10-CM | POA: Diagnosis not present

## 2021-11-08 LAB — CBC WITH DIFFERENTIAL/PLATELET
Abs Immature Granulocytes: 0.01 10*3/uL (ref 0.00–0.07)
Basophils Absolute: 0 10*3/uL (ref 0.0–0.1)
Basophils Relative: 1 %
Eosinophils Absolute: 0.1 10*3/uL (ref 0.0–0.5)
Eosinophils Relative: 2 %
HCT: 39.7 % (ref 36.0–46.0)
Hemoglobin: 12.8 g/dL (ref 12.0–15.0)
Immature Granulocytes: 0 %
Lymphocytes Relative: 21 %
Lymphs Abs: 1.4 10*3/uL (ref 0.7–4.0)
MCH: 32.2 pg (ref 26.0–34.0)
MCHC: 32.2 g/dL (ref 30.0–36.0)
MCV: 100 fL (ref 80.0–100.0)
Monocytes Absolute: 0.4 10*3/uL (ref 0.1–1.0)
Monocytes Relative: 5 %
Neutro Abs: 4.9 10*3/uL (ref 1.7–7.7)
Neutrophils Relative %: 71 %
Platelets: 354 10*3/uL (ref 150–400)
RBC: 3.97 MIL/uL (ref 3.87–5.11)
RDW: 13.4 % (ref 11.5–15.5)
WBC: 6.9 10*3/uL (ref 4.0–10.5)
nRBC: 0 % (ref 0.0–0.2)

## 2021-11-08 LAB — IRON AND TIBC
Iron: 76 ug/dL (ref 28–170)
Saturation Ratios: 17 % (ref 10.4–31.8)
TIBC: 449 ug/dL (ref 250–450)
UIBC: 373 ug/dL

## 2021-11-08 LAB — FERRITIN: Ferritin: 87 ng/mL (ref 11–307)

## 2021-11-08 LAB — VITAMIN B12: Vitamin B-12: 349 pg/mL (ref 180–914)

## 2021-11-11 LAB — METHYLMALONIC ACID, SERUM: Methylmalonic Acid, Quantitative: 114 nmol/L (ref 0–378)

## 2021-11-15 ENCOUNTER — Inpatient Hospital Stay (HOSPITAL_BASED_OUTPATIENT_CLINIC_OR_DEPARTMENT_OTHER): Payer: Medicaid Other | Admitting: Physician Assistant

## 2021-11-15 DIAGNOSIS — D509 Iron deficiency anemia, unspecified: Secondary | ICD-10-CM

## 2021-11-15 DIAGNOSIS — E538 Deficiency of other specified B group vitamins: Secondary | ICD-10-CM | POA: Diagnosis not present

## 2021-11-15 NOTE — Progress Notes (Signed)
Virtual Visit via Telephone Note [facility]  I connected with Vanessa Villanueva  on 11/15/21 at  4:02 PM by telephone and verified that I am speaking with the correct person using two identifiers.  Location: Patient: Home Provider: [facility]   I discussed the limitations, risks, security and privacy concerns of performing an evaluation and management service by telephone and the availability of in person appointments. I also discussed with the patient that there may be a patient responsible charge related to this service. The patient expressed understanding and agreed to proceed.  REASON FOR VISIT:  Follow-up for iron deficiency anemia   PRIOR THERAPY: Iron tablets   CURRENT THERAPY: IV Feraheme (02/21/2021 and 03/08/2021)  INTERVAL HISTORY: Ms. Vanessa Villanueva is contacted today for follow-up of her iron deficiency anemia.  She was last evaluated via telemedicine visit on 07/12/2021 by [name] PA-C.  At today's visit, she reports feeling somewhat better since our last visit.   She reports that she has not had a period since she started birth control in February 2023 and has noticed some improved energy and decreased headaches since that time.    She denies any pica, restless legs, chest pain, dyspnea on exertion, lightheadedness, or syncope.   She is taking iron tablets and B12 tablets daily.   She reports 100 % energy and 100 % appetite.  She had her gallbladder removed several months ago, and her abdominal pain and nausea have resolved.    OBSERVATIONS/OBJECTIVE:   Review of Systems  Constitutional:  Negative for chills, diaphoresis, fever and weight loss.  Respiratory:  Negative for cough and shortness of breath.   Cardiovascular:  Negative for chest pain and palpitations.  Gastrointestinal:  Negative for abdominal pain, blood in stool, melena, nausea and vomiting.  Neurological:  Positive for headaches. Negative for dizziness.   Psychiatric/Behavioral:  The patient has insomnia.      PHYSICAL EXAM (per limitations of virtual telephone visit): The patient is alert and oriented x 3, exhibiting adequate mentation, good mood, and ability to speak in full sentences and execute sound judgement.   ASSESSMENT & PLAN: 1.  Iron deficiency anemia - Patient seen at the request of [name] for iron deficiency with labs from 01/07/2021 showing ferritin 6, hemoglobin 10.8 and MCV 88.6. - Menstrual cycles used to be heavy, but she has not had a period since she started birth control in February 2023   - She denies any bright red blood per rectum or melena    - No history of blood transfusions - She failed to improve with oral iron supplementation. - She received IV Feraheme x2 on 02/21/2021 on 03/08/2021 - Symptoms after IV iron were improved, and she feels that she is continuing to improve on oral iron   - Most recent labs (11/08/2021): Hgb 12.8/MCV 100.0, ferritin 87, iron saturation 17% - PLAN: We will continue with oral iron supplementation for the time being, since she is no longer having menorrhagia. - Repeat labs in 6 months with phone visit to follow.     2.  Vitamin B12 deficiency - Labs checked by PCP (04/19/2021) show vitamin B12 low at 142 - She was started on vitamin B12 500 mcg daily by her PCP - Prior labs (07/04/2021) showed improved but marginal vitamin B12 at 210 - She is now taking vitamin B12 1000 mcg daily since May 2023 - Most recent labs (11/08/2021) show improved vitamin B12 349 and normal methylmalonic acid  114 - PLAN: Recommend continuing B12 1,000 mcg daily.  - We will check B12/methylmalonic acid in 6 months followed by phone visit  3.  Social/family history: - She lives at home with her husband.  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 - Sister had anemia.  No cancer in the family.  Her mother was adopted.   FOLLOW UP INSTRUCTIONS: Labs in 6 months Phone visit after labs    I  discussed the assessment and treatment plan with the patient. The patient was provided an opportunity to ask questions and all were answered. The patient agreed with the plan and demonstrated an understanding of the instructions.   The patient was advised to call back or seek an in-person evaluation if the symptoms worsen or if the condition fails to improve as anticipated.  I provided 15 minutes of non-face-to-face time during this encounter.   [name], PA-C 11/15/2021 4:17 PM

## 2021-11-21 DIAGNOSIS — R0981 Nasal congestion: Secondary | ICD-10-CM | POA: Diagnosis not present

## 2021-11-21 DIAGNOSIS — R051 Acute cough: Secondary | ICD-10-CM | POA: Diagnosis not present

## 2021-11-21 DIAGNOSIS — J00 Acute nasopharyngitis [common cold]: Secondary | ICD-10-CM | POA: Diagnosis not present

## 2021-11-28 DIAGNOSIS — R079 Chest pain, unspecified: Secondary | ICD-10-CM | POA: Diagnosis not present

## 2021-11-28 DIAGNOSIS — Z885 Allergy status to narcotic agent status: Secondary | ICD-10-CM | POA: Diagnosis not present

## 2021-11-28 DIAGNOSIS — R0789 Other chest pain: Secondary | ICD-10-CM | POA: Diagnosis not present

## 2021-11-28 DIAGNOSIS — F17210 Nicotine dependence, cigarettes, uncomplicated: Secondary | ICD-10-CM | POA: Diagnosis not present

## 2021-12-06 ENCOUNTER — Ambulatory Visit: Payer: Medicaid Other | Admitting: Family Medicine

## 2021-12-10 ENCOUNTER — Ambulatory Visit: Payer: Medicaid Other | Admitting: Nurse Practitioner

## 2021-12-10 ENCOUNTER — Encounter: Payer: Self-pay | Admitting: Nurse Practitioner

## 2021-12-10 VITALS — BP 127/75 | HR 85 | Temp 98.7°F | Ht 61.0 in | Wt 114.4 lb

## 2021-12-10 DIAGNOSIS — R0789 Other chest pain: Secondary | ICD-10-CM

## 2021-12-10 NOTE — Patient Instructions (Signed)
   Chest Wall Pain Chest wall pain is pain in or around the bones and muscles of your chest. Chest wall pain may be caused by: An injury. Coughing a lot. Using your chest and arm muscles too much. Sometimes, the cause may not be known. This pain may take a few weeks or longer to get better. Follow these instructions at home: Managing pain, stiffness, and swelling If told, put ice on the painful area: Put ice in a plastic bag. Place a towel between your skin and the bag. Leave the ice on for 20 minutes, 2-3 times a day.  Activity Rest as told by your doctor. Avoid doing things that cause pain. This includes lifting heavy items. Ask your doctor what activities are safe for you. General instructions  Take over-the-counter and prescription medicines only as told by your doctor. Do not use any products that contain nicotine or tobacco, such as cigarettes, e-cigarettes, and chewing tobacco. If you need help quitting, ask your doctor. Keep all follow-up visits as told by your doctor. This is important. Contact a doctor if: You have a fever. Your chest pain gets worse. You have new symptoms. Get help right away if: You feel sick to your stomach (nauseous) or you throw up (vomit). You feel sweaty or light-headed. You have a cough with mucus from your lungs (sputum) or you cough up blood. You are short of breath. These symptoms may be an emergency. Do not wait to see if the symptoms will go away. Get medical help right away. Call your local emergency services (911 in the [place] Do not drive yourself to the hospital. Summary Chest wall pain is pain in or around the bones and muscles of your chest. It may be treated with ice, rest, and medicines. Your condition may also get better if you avoid doing things that cause pain. Contact a doctor if you have a fever, chest pain that gets worse, or new symptoms. Get help right away if you feel light-headed or you get short of breath. These symptoms  may be an emergency. This information is not intended to replace advice given to you by your health care provider. Make sure you discuss any questions you have with your health care provider. Document Revised: 05/22/2020 Document Reviewed: 05/04/2020 Elsevier Patient Education  2023 Elsevier Inc.  

## 2021-12-10 NOTE — Progress Notes (Signed)
Acute Office Visit  Subjective:     Patient ID: Vanessa Villanueva, female    DOB: [birth year], 31 y.o.   MRN: [id number]  Chief Complaint  Patient presents with   Hospitalization Follow-up    Chest pain - pt state it is still the same , hurts to breath     Chest Pain  This is a recurrent problem. The current episode started 1 to 4 weeks ago. The onset quality is gradual. The problem occurs constantly. The problem has been unchanged. The pain is moderate. The quality of the pain is described as dull. The pain does not radiate. Pertinent negatives include no abdominal pain, back pain, cough, malaise/fatigue or nausea. The pain is aggravated by lifting. She has tried NSAIDs for the symptoms. The treatment provided moderate relief. Risk factors include smoking/tobacco exposure.    Review of Systems  Constitutional: Negative.  Negative for malaise/fatigue.  HENT: Negative.    Respiratory:  Negative for cough.   Cardiovascular:  Positive for chest pain.       Chest wall  Gastrointestinal:  Negative for abdominal pain and nausea.  Musculoskeletal:  Negative for back pain.  Skin: Negative.   All other systems reviewed and are negative.       Objective:    BP 127/75   Pulse 85   Temp 98.7 F (37.1 C)   Ht '5\' 1"'$  (1.549 m)   Wt 114 lb 6.4 oz (51.9 kg)   LMP 04/11/2021 Comment: on pill  SpO2 98%   BMI 21.62 kg/m  BP Readings from Last 3 Encounters:  12/10/21 127/75  07/30/21 99/62  07/03/21 110/75      Physical Exam Vitals and nursing note reviewed.  Constitutional:      Appearance: Normal appearance.  HENT:     Head: Normocephalic.     Right Ear: External ear normal.     Left Ear: External ear normal.     Nose: Nose normal.     Mouth/Throat:     Mouth: Mucous membranes are moist.     Pharynx: Oropharynx is clear.  Eyes:     Conjunctiva/sclera: Conjunctivae normal.  Cardiovascular:     Rate and Rhythm: Normal rate and regular rhythm.     Pulses: Normal pulses.      Heart sounds: Normal heart sounds.  Pulmonary:     Effort: Pulmonary effort is normal.     Breath sounds: Normal breath sounds.  Abdominal:     General: Bowel sounds are normal.  Musculoskeletal:       Arms:     Comments: Chest wall pain  Skin:    General: Skin is warm.     Findings: No erythema or rash.  Neurological:     General: No focal deficit present.     Mental Status: She is alert and oriented to person, place, and time.  Psychiatric:        Mood and Affect: Mood normal.        Behavior: Behavior normal.     No results found for any visits on 12/10/21.      Assessment & Plan:  Patient is following up after DE visit for chest wall pain. Patient reports that pain is not improving she is told from the ED to repeat chest x-ray in 2 weeks. I advised patient to continue antiinflammatory, repeat chest x-ray set for Friday, work restrictions placed not to lift over 5lb and do not lift hands above head for 2 weeks,   Return  with unresolved symptoms.    Problem List Items Addressed This Visit   None Visit Diagnoses     Chest wall pain    -  Primary   Relevant Orders   DG Chest 2 View       No orders of the defined types were placed in this encounter.   No follow-ups on file.  Ivy Lynn, NP

## 2021-12-13 ENCOUNTER — Other Ambulatory Visit (INDEPENDENT_AMBULATORY_CARE_PROVIDER_SITE_OTHER): Payer: Medicaid Other

## 2021-12-13 DIAGNOSIS — R0789 Other chest pain: Secondary | ICD-10-CM | POA: Diagnosis not present

## 2022-01-31 ENCOUNTER — Other Ambulatory Visit (INDEPENDENT_AMBULATORY_CARE_PROVIDER_SITE_OTHER): Payer: Self-pay | Admitting: Gastroenterology

## 2022-01-31 ENCOUNTER — Other Ambulatory Visit: Payer: Self-pay | Admitting: Family

## 2022-01-31 DIAGNOSIS — D509 Iron deficiency anemia, unspecified: Secondary | ICD-10-CM

## 2022-01-31 DIAGNOSIS — Z30011 Encounter for initial prescription of contraceptive pills: Secondary | ICD-10-CM

## 2022-04-15 DIAGNOSIS — R059 Cough, unspecified: Secondary | ICD-10-CM | POA: Diagnosis not present

## 2022-04-15 DIAGNOSIS — R52 Pain, unspecified: Secondary | ICD-10-CM | POA: Diagnosis not present

## 2022-04-15 DIAGNOSIS — J00 Acute nasopharyngitis [common cold]: Secondary | ICD-10-CM | POA: Diagnosis not present

## 2022-05-16 ENCOUNTER — Inpatient Hospital Stay: Payer: Medicaid Other | Attending: Hematology

## 2022-05-16 DIAGNOSIS — E538 Deficiency of other specified B group vitamins: Secondary | ICD-10-CM | POA: Insufficient documentation

## 2022-05-16 DIAGNOSIS — D509 Iron deficiency anemia, unspecified: Secondary | ICD-10-CM | POA: Insufficient documentation

## 2022-05-16 LAB — CBC WITH DIFFERENTIAL/PLATELET
Abs Immature Granulocytes: 0.02 10*3/uL (ref 0.00–0.07)
Basophils Absolute: 0.1 10*3/uL (ref 0.0–0.1)
Basophils Relative: 1 %
Eosinophils Absolute: 0.1 10*3/uL (ref 0.0–0.5)
Eosinophils Relative: 1 %
HCT: 39.1 % (ref 36.0–46.0)
Hemoglobin: 12.5 g/dL (ref 12.0–15.0)
Immature Granulocytes: 0 %
Lymphocytes Relative: 23 %
Lymphs Abs: 1.5 10*3/uL (ref 0.7–4.0)
MCH: 32 pg (ref 26.0–34.0)
MCHC: 32 g/dL (ref 30.0–36.0)
MCV: 100 fL (ref 80.0–100.0)
Monocytes Absolute: 0.5 10*3/uL (ref 0.1–1.0)
Monocytes Relative: 7 %
Neutro Abs: 4.6 10*3/uL (ref 1.7–7.7)
Neutrophils Relative %: 68 %
Platelets: 294 10*3/uL (ref 150–400)
RBC: 3.91 MIL/uL (ref 3.87–5.11)
RDW: 13.1 % (ref 11.5–15.5)
WBC: 6.8 10*3/uL (ref 4.0–10.5)
nRBC: 0 % (ref 0.0–0.2)

## 2022-05-16 LAB — IRON AND TIBC
Iron: 131 ug/dL (ref 28–170)
Saturation Ratios: 31 % (ref 10.4–31.8)
TIBC: 425 ug/dL (ref 250–450)
UIBC: 294 ug/dL

## 2022-05-16 LAB — FERRITIN: Ferritin: 118 ng/mL (ref 11–307)

## 2022-05-16 LAB — VITAMIN B12: Vitamin B-12: 538 pg/mL (ref 180–914)

## 2022-05-17 IMAGING — DX DG CHEST 2V
2 series · 2 of 2 positions shown · non-contrast
Comparison: None.

CLINICAL DATA: Chest pain

EXAM:
CHEST - 2 VIEW

[chest pa]
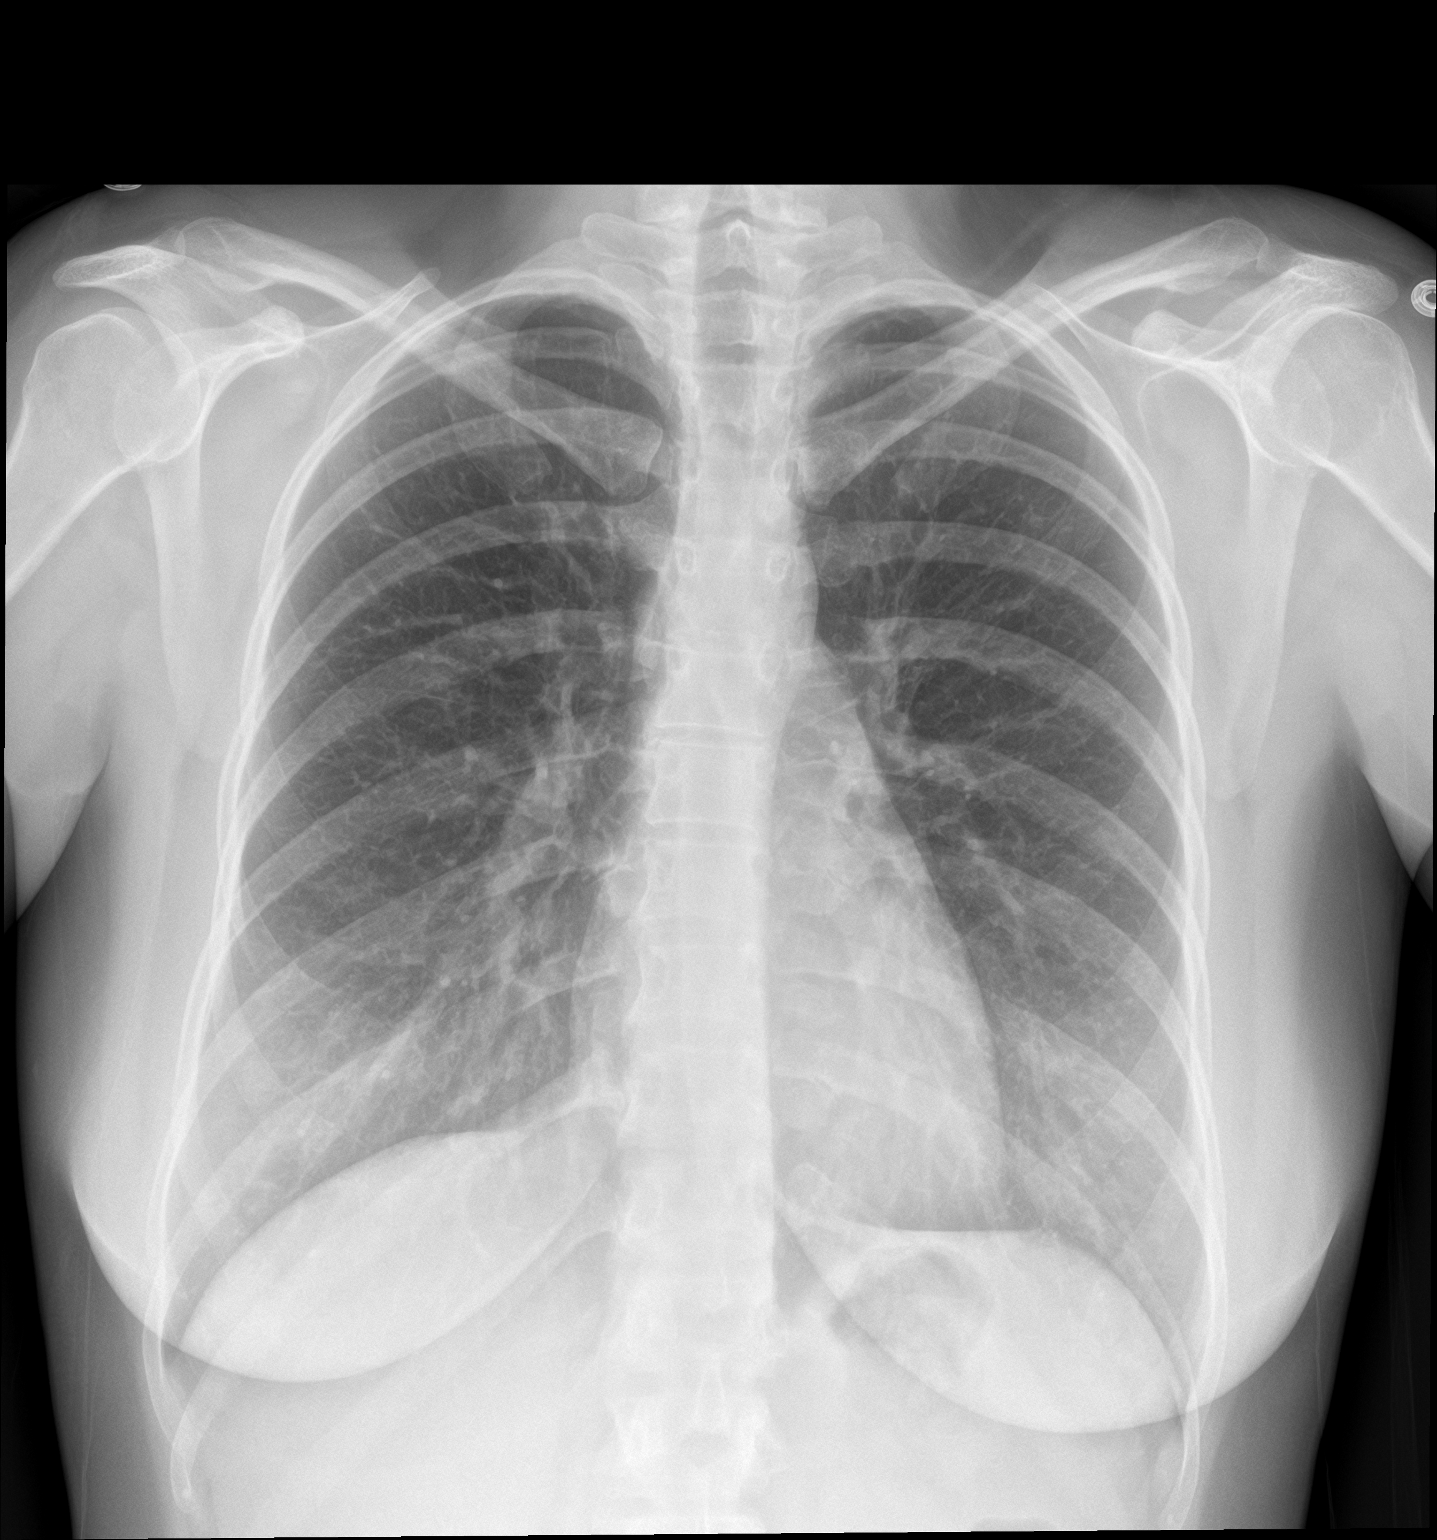

[chest lat]
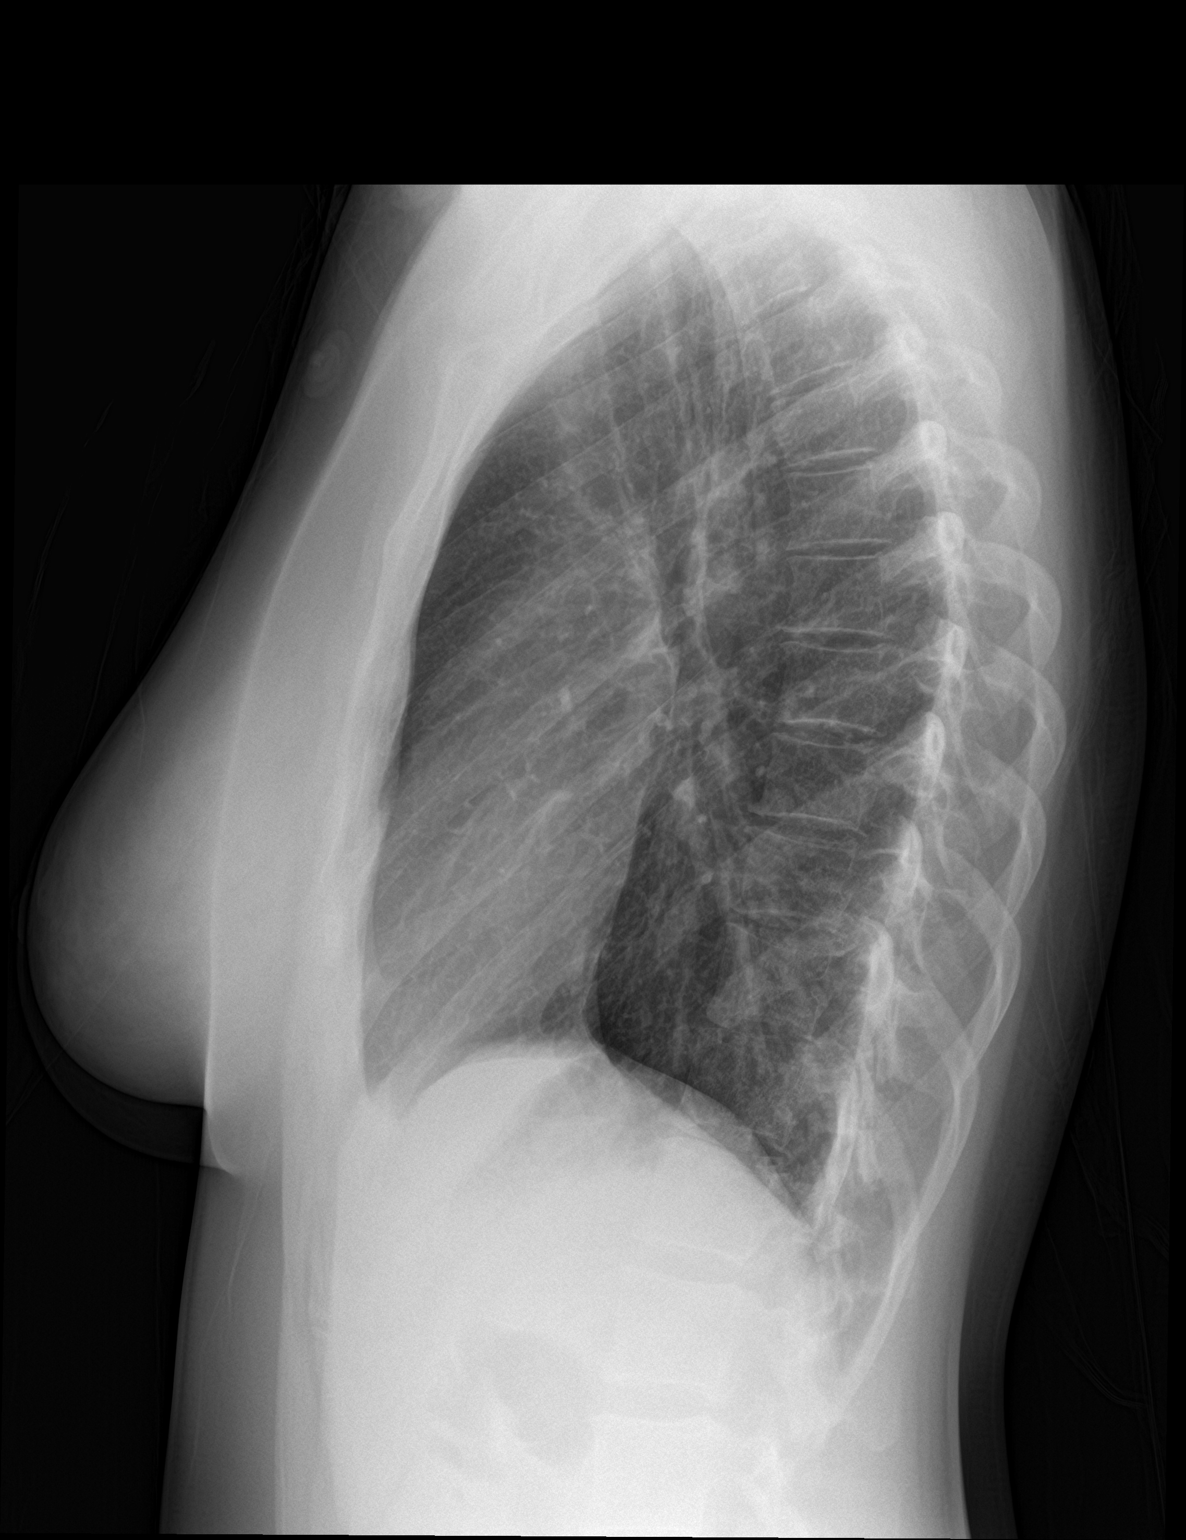

[2 of 2 positions shown; findings below may reference images not displayed]

FINDINGS: The lungs are clear without focal pneumonia, edema, pneumothorax or
pleural effusion. The cardiopericardial silhouette is within normal
limits for size. The visualized bony structures of the thorax show
no acute abnormality.
IMPRESSION: No active cardiopulmonary disease.

## 2022-05-21 LAB — METHYLMALONIC ACID, SERUM: Methylmalonic Acid, Quantitative: 121 nmol/L (ref 0–378)

## 2022-05-23 ENCOUNTER — Inpatient Hospital Stay (HOSPITAL_BASED_OUTPATIENT_CLINIC_OR_DEPARTMENT_OTHER): Payer: Medicaid Other | Admitting: Physician Assistant

## 2022-05-23 DIAGNOSIS — D500 Iron deficiency anemia secondary to blood loss (chronic): Secondary | ICD-10-CM

## 2022-05-23 DIAGNOSIS — E538 Deficiency of other specified B group vitamins: Secondary | ICD-10-CM

## 2022-05-23 DIAGNOSIS — D509 Iron deficiency anemia, unspecified: Secondary | ICD-10-CM

## 2022-05-23 NOTE — Progress Notes (Signed)
VIRTUAL VISIT via [place]   I connected with Vanessa Villanueva  on 05/23/2022 at 11:00 AM by telephone and verified that I am speaking with the correct person using two identifiers.  Location: Patient: Home Provider: [facility]   I discussed the limitations, risks, security and privacy concerns of performing an evaluation and management service by telephone and the availability of in person appointments. I also discussed with the patient that there may be a patient responsible charge related to this service. The patient expressed understanding and agreed to proceed.  REASON FOR VISIT: Iron deficiency anemia  PRIOR THERAPY: Iron tablets  CURRENT THERAPY: IV Feraheme (last given 02/21/2021 and 03/08/2021)  INTERVAL HISTORY:  Vanessa Villanueva is contacted today for follow-up of her iron deficiency anemia.  She was last evaluated via telemedicine visit by [name] PA-C on 11/15/2021. At today's visit, she reports feeling fairly well apart from her chronic headaches.  She started birth control in February 2023 and has not had any menstrual periods since that time, although she does report some "mild spotting" every few months.  She denies any hematochezia or melena.  She denies any pica, restless legs, chest pain, dyspnea on exertion, lightheadedness, or syncope.  She is taking iron tablets and B12 tablets daily.   She reports 80% energy and 60% appetite.    REVIEW OF SYSTEMS:   Review of Systems  Constitutional:  Negative for chills, diaphoresis, fever, malaise/fatigue and weight loss.  Respiratory:  Negative for cough and shortness of breath.   Cardiovascular:  Negative for chest pain and palpitations.  Gastrointestinal:  Negative for abdominal pain, blood in stool, melena, nausea and vomiting.  Neurological:  Positive for headaches. Negative for dizziness.  Psychiatric/Behavioral:  The patient has insomnia.     PHYSICAL  EXAM: (per limitations of virtual telephone visit)  The patient is alert and oriented x 3, exhibiting adequate mentation, good mood, and ability to speak in full sentences and execute sound judgement.  ASSESSMENT & PLAN:  1.  Iron deficiency anemia - Patient seen at the request of [name] for iron deficiency with labs from 01/07/2021 showing ferritin 6, hemoglobin 10.8 and MCV 88.6. - Menstrual cycles used to be heavy, but she has not had a period since she started birth control in February 2023 (mild spotting every few months) - She denies any bright red blood per rectum or melena    - No history of blood transfusions - She received IV Feraheme x2 on 02/21/2021 on 03/08/2021.  She takes iron supplement daily. - Most recent labs (05/16/2022): Hgb 12.5/MCV 100.0, ferritin 118, iron saturation 31 % - PLAN: We will continue with oral iron supplementation for the time being, since she is no longer having menorrhagia. - Repeat labs in 1 year with office visit to follow.   2.  Vitamin B12 deficiency - Labs checked by PCP (04/19/2021) show vitamin B12 low at 142 - She was started on vitamin B12 500 mcg daily by her PCP - Labs (07/04/2021) showed improved but marginal vitamin B12 at 210 - She is now taking vitamin B12 1000 mcg daily since May 2023 - Most recent labs (05/16/2022) show improved vitamin B12 538 and normal methylmalonic acid - PLAN: Recommend continuing B12 1,000 mcg daily.  - We will check B12/methylmalonic acid in 1 year followed by office visit  3.  Social/family history: - She lives at home with her husband.  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 - Sister had anemia.  No cancer in the family.  Her mother was adopted.    PLAN SUMMARY: >> Labs in 1 year = CBC/D, ferritin, iron/TIBC, B12, MMA >> OFFICE visit in 1 year (1 week after labs)     I discussed the assessment and treatment plan with the patient. The patient was provided an opportunity to ask questions  and all were answered. The patient agreed with the plan and demonstrated an understanding of the instructions.   The patient was advised to call back or seek an in-person evaluation if the symptoms worsen or if the condition fails to improve as anticipated.  I provided 18 minutes of non-face-to-face time during this encounter.  Vanessa Rush, PA-C 05/23/22 11:22 AM

## 2022-05-26 ENCOUNTER — Other Ambulatory Visit: Payer: Self-pay

## 2022-05-26 DIAGNOSIS — D509 Iron deficiency anemia, unspecified: Secondary | ICD-10-CM

## 2022-05-26 DIAGNOSIS — E538 Deficiency of other specified B group vitamins: Secondary | ICD-10-CM

## 2022-05-26 DIAGNOSIS — D500 Iron deficiency anemia secondary to blood loss (chronic): Secondary | ICD-10-CM

## 2022-07-11 ENCOUNTER — Other Ambulatory Visit: Payer: Self-pay | Admitting: Family

## 2022-07-11 DIAGNOSIS — Z30011 Encounter for initial prescription of contraceptive pills: Secondary | ICD-10-CM

## 2022-07-11 NOTE — Telephone Encounter (Signed)
[facility] last encounter for Dx was 04/19/21 no refill sent to pharmacy

## 2022-07-14 ENCOUNTER — Telehealth: Payer: Self-pay | Admitting: Family

## 2022-07-14 MED ORDER — LEVONORGESTREL-ETHINYL ESTRAD 90-20 MCG PO TABS: 1.0000 | ORAL_TABLET | Freq: Every day | ORAL | 0 refills | Status: DC

## 2022-07-14 NOTE — Telephone Encounter (Signed)
  Prescription Request  07/14/2022  Is this a "Controlled Substance" medicine?   Have you seen your PCP in the last 2 weeks? Made appt for 6/6   If YES, route message to pool  -  If NO, patient needs to be scheduled for appointment.  What is the name of the medication or equipment? levonorgestrel-ethinyl estradiol (LYBREL) 90-20 MCG tablet  Have you contacted your pharmacy to request a refill? Yes    Which pharmacy would you like this sent to?  [id number] - [place], [place] - [address] (Ph: [phone])   Patient notified that their request is being sent to the clinical staff for review and that they should receive a response within 2 business days.

## 2022-07-14 NOTE — Telephone Encounter (Signed)
Pt made appt on 6-6

## 2022-07-14 NOTE — Telephone Encounter (Signed)
Pt aware refill sent to pharmacy, done under electronic request

## 2022-07-14 NOTE — Addendum Note (Signed)
Addended by: Julious Payer D on: 07/14/2022 10:02 AM   Modules accepted: Orders

## 2022-08-07 ENCOUNTER — Ambulatory Visit: Payer: Medicaid Other | Admitting: Family

## 2022-08-07 ENCOUNTER — Telehealth: Payer: Self-pay

## 2022-08-07 ENCOUNTER — Encounter: Payer: Self-pay | Admitting: Family

## 2022-08-07 VITALS — BP 113/79 | HR 98 | Temp 98.1°F | Ht 61.0 in | Wt 119.0 lb

## 2022-08-07 DIAGNOSIS — R222 Localized swelling, mass and lump, trunk: Secondary | ICD-10-CM | POA: Diagnosis not present

## 2022-08-07 DIAGNOSIS — R6889 Other general symptoms and signs: Secondary | ICD-10-CM | POA: Diagnosis not present

## 2022-08-07 DIAGNOSIS — K5909 Other constipation: Secondary | ICD-10-CM

## 2022-08-07 DIAGNOSIS — Z30011 Encounter for initial prescription of contraceptive pills: Secondary | ICD-10-CM

## 2022-08-07 DIAGNOSIS — E538 Deficiency of other specified B group vitamins: Secondary | ICD-10-CM

## 2022-08-07 DIAGNOSIS — F321 Major depressive disorder, single episode, moderate: Secondary | ICD-10-CM | POA: Diagnosis not present

## 2022-08-07 DIAGNOSIS — R079 Chest pain, unspecified: Secondary | ICD-10-CM | POA: Diagnosis not present

## 2022-08-07 DIAGNOSIS — D509 Iron deficiency anemia, unspecified: Secondary | ICD-10-CM

## 2022-08-07 DIAGNOSIS — F411 Generalized anxiety disorder: Secondary | ICD-10-CM

## 2022-08-07 DIAGNOSIS — Z0001 Encounter for general adult medical examination with abnormal findings: Secondary | ICD-10-CM

## 2022-08-07 DIAGNOSIS — Z0000 Encounter for general adult medical examination without abnormal findings: Secondary | ICD-10-CM | POA: Diagnosis not present

## 2022-08-07 DIAGNOSIS — D649 Anemia, unspecified: Secondary | ICD-10-CM

## 2022-08-07 MED ORDER — LEVONORGESTREL-ETHINYL ESTRAD 90-20 MCG PO TABS
1.0000 | ORAL_TABLET | Freq: Every day | ORAL | 1 refills | Status: DC
Start: 2022-08-07 — End: 2023-02-03

## 2022-08-07 MED ORDER — DICLOFENAC SODIUM 75 MG PO TBEC
75.0000 mg | DELAYED_RELEASE_TABLET | Freq: Two times a day (BID) | ORAL | 2 refills | Status: DC
Start: 2022-08-07 — End: 2023-04-13

## 2022-08-07 NOTE — Telephone Encounter (Signed)
*  Primary  PA request received via CMM for Diclofenac Sodium 75MG  dr tablets  PA submitted to [facility] Medicaid and has been APPROVED from 08/07/2022-08/06/2023  Key: [id number]

## 2022-08-07 NOTE — Progress Notes (Signed)
Subjective:    Patient ID: Vanessa Villanueva, female    DOB: [birth year], 32 y.o.   MRN: [id number]  Chief Complaint  Patient presents with   Medical Management of Chronic Issues   PT presents to the office today CPE without pap. She is followed by Hematologists annually for iron anemia.  Constipation This is a chronic problem. The current episode started more than 1 year ago. She has tried laxatives for the symptoms. The treatment provided moderate relief.  Anemia Presents for follow-up visit. There has been no malaise/fatigue.  Anxiety Presents for follow-up visit. Symptoms include chest pain, excessive worry, nervous/anxious behavior and restlessness. The severity of symptoms is mild.   Her past medical history is significant for anemia.  Depression        This is a chronic problem.  The current episode started more than 1 year ago.   The onset quality is gradual.   The problem occurs intermittently.  Associated symptoms include restlessness.  Associated symptoms include no helplessness, no hopelessness and not sad.  Past treatments include SSRIs - Selective serotonin reuptake inhibitors.  Past medical history includes anxiety.   Chest Pain  This is a new problem. The current episode started more than 1 month ago. The onset quality is gradual. The problem has been waxing and waning. The pain is at a severity of 10/10. The pain is mild. The pain radiates to the left shoulder and left arm. Pertinent negatives include no malaise/fatigue. She has tried rest and NSAIDs for the symptoms. The treatment provided mild relief.      Review of Systems  Constitutional:  Negative for malaise/fatigue.  Cardiovascular:  Positive for chest pain.  Gastrointestinal:  Positive for constipation.  Psychiatric/Behavioral:  Positive for depression. The patient is nervous/anxious.   All other systems reviewed and are negative.  Family History  Problem Relation Age of Onset   Diabetes Mother    COPD  Mother    Mental illness Father        Bipolar, schizophenia   Social History   Socioeconomic History   Marital status: Married    Spouse name: Not on file   Number of children: Not on file   Years of education: Not on file   Highest education level: Not on file  Occupational History   Not on file  Tobacco Use   Smoking status: Never   Smokeless tobacco: Never  Vaping Use   Vaping Use: Every day  Substance and Sexual Activity   Alcohol use: No   Drug use: No   Sexual activity: Yes    Birth control/protection: Surgical  Other Topics Concern   Not on file  Social History Narrative   Not on file   Social Determinants of Health   Financial Resource Strain: Low Risk  (08/07/2022)   Overall Financial Resource Strain (CARDIA)    Difficulty of Paying Living Expenses: Not hard at all  Food Insecurity: No Food Insecurity (08/07/2022)   Hunger Vital Sign    Worried About Running Out of Food in the Last Year: Never true    Ran Out of Food in the Last Year: Never true  Transportation Needs: Patient Declined (08/07/2022)   PRAPARE - Transportation    Lack of Transportation (Medical): Patient declined    Lack of Transportation (Non-Medical): Patient declined  Physical Activity: Unknown (08/07/2022)   Exercise Vital Sign    Days of Exercise per Week: Patient declined    Minutes of Exercise per Session: Not on  file  Stress: No Stress Concern Present (08/07/2022)   [employer] of Occupational Health - Occupational Stress Questionnaire    Feeling of Stress : Not at all  Social Connections: Unknown (08/07/2022)   Social Connection and Isolation Panel [NHANES]    Frequency of Communication with Friends and Family: Patient declined    Frequency of Social Gatherings with Friends and Family: Patient declined    Attends Religious Services: Patient declined    Database administrator or Organizations: Patient declined    Attends Banker Meetings: Not on file    Marital Status:  Patient declined       Objective:   Physical Exam Vitals reviewed.  Constitutional:      General: She is not in acute distress.    Appearance: She is well-developed.  HENT:     Head: Normocephalic and atraumatic.     Right Ear: Tympanic membrane normal.     Left Ear: Tympanic membrane normal.  Eyes:     Pupils: Pupils are equal, round, and reactive to light.  Neck:     Thyroid: No thyromegaly.  Cardiovascular:     Rate and Rhythm: Normal rate and regular rhythm.     Heart sounds: Normal heart sounds. No murmur heard. Pulmonary:     Effort: Pulmonary effort is normal. No respiratory distress.     Breath sounds: Normal breath sounds. No wheezing.  Abdominal:     General: Bowel sounds are normal. There is no distension.     Palpations: Abdomen is soft.     Tenderness: There is no abdominal tenderness.  Musculoskeletal:        General: No tenderness. Normal range of motion.     Cervical back: Normal range of motion and neck supple.  Skin:    General: Skin is warm and dry.  Neurological:     Mental Status: She is alert and oriented to person, place, and time.     Cranial Nerves: No cranial nerve deficit.     Deep Tendon Reflexes: Reflexes are normal and symmetric.  Psychiatric:        Behavior: Behavior normal.        Thought Content: Thought content normal.        Judgment: Judgment normal.       BP 113/79   Pulse 98   Temp 98.1 F (36.7 C) (Temporal)   Ht 5\' 1"  (1.549 m)   Wt 119 lb (54 kg)   SpO2 97%   BMI 22.48 kg/m      Assessment & Plan:  Vanessa Villanueva comes in today with chief complaint of Medical Management of Chronic Issues   Diagnosis and orders addressed:  1. Annual physical exam - CMP14+EGFR - Lipid panel - TSH - EKG 12-Lead  2. Chest pain, unspecified type Start diclofenac BID  Avoid other NSAID's  - CMP14+EGFR - EKG 12-Lead  3. Anemia, unspecified type  4. Chronic constipation  5. Depression, major, single episode, moderate  (HCC)  6. GAD (generalized anxiety disorder)  7. Iron deficiency anemia, unspecified iron deficiency anemia type  8. Vitamin B12 deficiency  9. Lump in chest - [place] CHEST SOFT TISSUE; Future   Labs pending Health Maintenance reviewed Diet and exercise encouraged  Follow up plan: 6 months    Jannifer Rodney, FNP

## 2022-08-07 NOTE — Addendum Note (Signed)
Addended by: Jannifer Rodney A on: 08/07/2022 02:43 PM   Modules accepted: Orders

## 2022-08-07 NOTE — Patient Instructions (Signed)
Chest Wall Pain Chest wall pain is pain in or around the bones and muscles of your chest. Sometimes, an injury causes this pain. Excessive coughing or overuse of arm and chest muscles may also cause chest wall pain. Sometimes, the cause may not be known. This pain may take several weeks or longer to get better. Follow these instructions at home: Managing pain, stiffness, and swelling  If directed, put ice on the painful area: Put ice in a plastic bag. Place a towel between your skin and the bag. Leave the ice on for 20 minutes, 2-3 times per day. Activity Rest as told by your health care provider. Avoid activities that cause pain. These include any activities that use your chest muscles or your abdominal and side muscles to lift heavy items. Ask your health care provider what activities are safe for you. General instructions  Take over-the-counter and prescription medicines only as told by your health care provider. Do not use any products that contain nicotine or tobacco, such as cigarettes, e-cigarettes, and chewing tobacco. These can delay healing after injury. If you need help quitting, ask your health care provider. Keep all follow-up visits as told by your health care provider. This is important. Contact a health care provider if: You have a fever. Your chest pain becomes worse. You have new symptoms. Get help right away if: You have nausea or vomiting. You feel sweaty or light-headed. You have a cough with mucus from your lungs (sputum) or you cough up blood. You develop shortness of breath. These symptoms may represent a serious problem that is an emergency. Do not wait to see if the symptoms will go away. Get medical help right away. Call your local emergency services (911 in the [place] Do not drive yourself to the hospital. Summary Chest wall pain is pain in or around the bones and muscles of your chest. Depending on the cause, it may be treated with ice, rest, medicines, and  avoiding activities that cause pain. Contact a health care provider if you have a fever, worsening chest pain, or new symptoms. Get help right away if you feel light-headed or you develop shortness of breath. These symptoms may be an emergency. This information is not intended to replace advice given to you by your health care provider. Make sure you discuss any questions you have with your health care provider. Document Revised: 02/10/2022 Document Reviewed: 02/10/2022 Elsevier Patient Education  2024 Elsevier Inc.  

## 2022-08-08 LAB — LIPID PANEL
Chol/HDL Ratio: 3.2 ratio (ref 0.0–4.4)
Cholesterol, Total: 219 mg/dL — ABNORMAL HIGH (ref 100–199)
HDL: 69 mg/dL (ref >39)
LDL Chol Calc (NIH): 129 mg/dL — ABNORMAL HIGH (ref 0–99)
Triglycerides: 121 mg/dL (ref 0–149)
VLDL Cholesterol Cal: 21 mg/dL (ref 5–40)

## 2022-08-08 LAB — CMP14+EGFR
ALT: 12 IU/L (ref 0–32)
AST: 14 IU/L (ref 0–40)
Albumin/Globulin Ratio: 1.7 (ref 1.2–2.2)
Albumin: 4.3 g/dL (ref 3.9–4.9)
Alkaline Phosphatase: 54 IU/L (ref 44–121)
BUN/Creatinine Ratio: 9 (ref 9–23)
BUN: 7 mg/dL (ref 6–20)
Bilirubin Total: 1.2 mg/dL (ref 0.0–1.2)
CO2: 21 mmol/L (ref 20–29)
Calcium: 9 mg/dL (ref 8.7–10.2)
Chloride: 104 mmol/L (ref 96–106)
Creatinine, Ser: 0.78 mg/dL (ref 0.57–1.00)
Globulin, Total: 2.6 g/dL (ref 1.5–4.5)
Glucose: 87 mg/dL (ref 70–99)
Potassium: 4.1 mmol/L (ref 3.5–5.2)
Sodium: 139 mmol/L (ref 134–144)
Total Protein: 6.9 g/dL (ref 6.0–8.5)
eGFR: 102 mL/min/{1.73_m2} (ref >59)

## 2022-08-08 LAB — TSH: TSH: 1 u[IU]/mL (ref 0.450–4.500)

## 2022-08-20 ENCOUNTER — Encounter (HOSPITAL_COMMUNITY): Payer: Self-pay

## 2022-08-20 ENCOUNTER — Other Ambulatory Visit (HOSPITAL_COMMUNITY): Payer: Self-pay | Admitting: Family

## 2022-08-20 ENCOUNTER — Ambulatory Visit (HOSPITAL_COMMUNITY)
Admission: RE | Admit: 2022-08-20 | Discharge: 2022-08-20 | Disposition: A | Discharge status: Home / Self Care | Payer: Medicaid Other | Source: Ambulatory Visit | Attending: Family | Admitting: Family

## 2022-08-20 DIAGNOSIS — N6320 Unspecified lump in the left breast, unspecified quadrant: Secondary | ICD-10-CM

## 2022-08-20 DIAGNOSIS — R222 Localized swelling, mass and lump, trunk: Secondary | ICD-10-CM

## 2022-08-21 ENCOUNTER — Telehealth: Payer: Self-pay | Admitting: Family Medicine

## 2022-08-21 DIAGNOSIS — R222 Localized swelling, mass and lump, trunk: Secondary | ICD-10-CM

## 2022-08-21 NOTE — Telephone Encounter (Signed)
Patient told them she did not think that she needed a Mammo and would not be doing that until she talked with you. Darl Pikes called to let us know do not see in her chart where she has contacted [place] about this yet.

## 2022-08-21 NOTE — Addendum Note (Signed)
Addended by: Jannifer Rodney A on: 08/21/2022 04:52 PM   Modules accepted: Orders

## 2022-08-21 NOTE — Telephone Encounter (Signed)
Mammogram ordered

## 2022-08-22 NOTE — Telephone Encounter (Signed)
Called and spoke with patient and she does not want the mammo the knot is near her neck she wants the chest u/s and sent to somewhere other than [facility]

## 2022-08-22 NOTE — Telephone Encounter (Signed)
I am sorry, but they will not do [place] unless you get mammogram first. I have ordered both.   Jannifer Rodney, FNP

## 2022-08-22 NOTE — Telephone Encounter (Signed)
Patient wants it done at [facility] if they will do it

## 2022-08-25 DIAGNOSIS — H6501 Acute serous otitis media, right ear: Secondary | ICD-10-CM | POA: Diagnosis not present

## 2022-08-25 DIAGNOSIS — R110 Nausea: Secondary | ICD-10-CM | POA: Diagnosis not present

## 2022-08-25 NOTE — Telephone Encounter (Signed)
Toni Amend can you please change mammogram to [facility].   Jannifer Rodney, FNP

## 2022-08-27 NOTE — Telephone Encounter (Signed)
Spoke to patient - she does not want mammogram - she wants just the US of the chest I have called [facility] schedule dept to find out if they will do US of the chest - patient states that the knot is right below her shoulder not in the breast area - I have not been able to get anyone on the phone will try again tomorrow

## 2022-08-28 NOTE — Telephone Encounter (Signed)
Spoke to the [place] dept at [facility] - they are going to contact [facility] and see if they can schedule the patient there for [place] st chest there incase she does need the mammogram and US of the breast.  - Their office will call me back

## 2022-08-28 NOTE — Telephone Encounter (Signed)
Pt r/c.

## 2022-08-29 NOTE — Telephone Encounter (Signed)
I have spoke to [facility]) - they are willing to schedule the patient at the [facility] on a day they do diagnostic mammograms - she states they will have the radiology look at the location of the knot and determine at that time which imaging will be best. I faxed over orders for the [place] chest ST, diagnostic mammo, and US breast. I have called and spoken to the patient in detail about the imaging. She expressed an extreme amount of anxiety at the thought of having to do the mammogram - I explain that they will look at the area 1st and discuss with her what they feel is needed before start any exam. The patient states that she has a view of getting stuck in the machine - I explain as best as I could and with what know I have that this would be highly unlikely but the tech could answer and address those fears at the time of appointment with more detail than I can. I also explain to the patient that if she still did not feel comfortable she did not have to agree with having the mammogram if that is what they decide is best. But medical this may be the best option to allow for imaging of the area and obtaining the most accurate assessment.

## 2022-09-10 DIAGNOSIS — N6320 Unspecified lump in the left breast, unspecified quadrant: Secondary | ICD-10-CM | POA: Diagnosis not present

## 2022-09-10 DIAGNOSIS — R222 Localized swelling, mass and lump, trunk: Secondary | ICD-10-CM | POA: Diagnosis not present

## 2022-10-05 DIAGNOSIS — H6690 Otitis media, unspecified, unspecified ear: Secondary | ICD-10-CM | POA: Diagnosis not present

## 2022-10-05 DIAGNOSIS — H9202 Otalgia, left ear: Secondary | ICD-10-CM | POA: Diagnosis not present

## 2022-12-02 ENCOUNTER — Telehealth: Payer: Self-pay

## 2022-12-02 NOTE — Telephone Encounter (Signed)
  Medicaid Managed Care   Unsuccessful Outreach Note  12/02/2022 Name: [name] MRN: [id number] DOB: [birth year]  Referred by: Junie Spencer, FNP Reason for referral : No chief complaint on file.   An unsuccessful telephone outreach was attempted today. The patient was referred to the case management team for assistance with care management and care coordination.    Nicholes Rough, CMA Care Guide [employer]

## 2023-02-03 ENCOUNTER — Other Ambulatory Visit: Payer: Self-pay | Admitting: Family

## 2023-02-03 DIAGNOSIS — Z30011 Encounter for initial prescription of contraceptive pills: Secondary | ICD-10-CM

## 2023-02-26 IMAGING — DX DG CHEST 2V
2 series · 2 of 2 positions shown · non-contrast
Comparison: Chest radiograph September 21, 2020

CLINICAL DATA: Left upper chest pain.

EXAM:
CHEST - 2 VIEW

[chest pa]
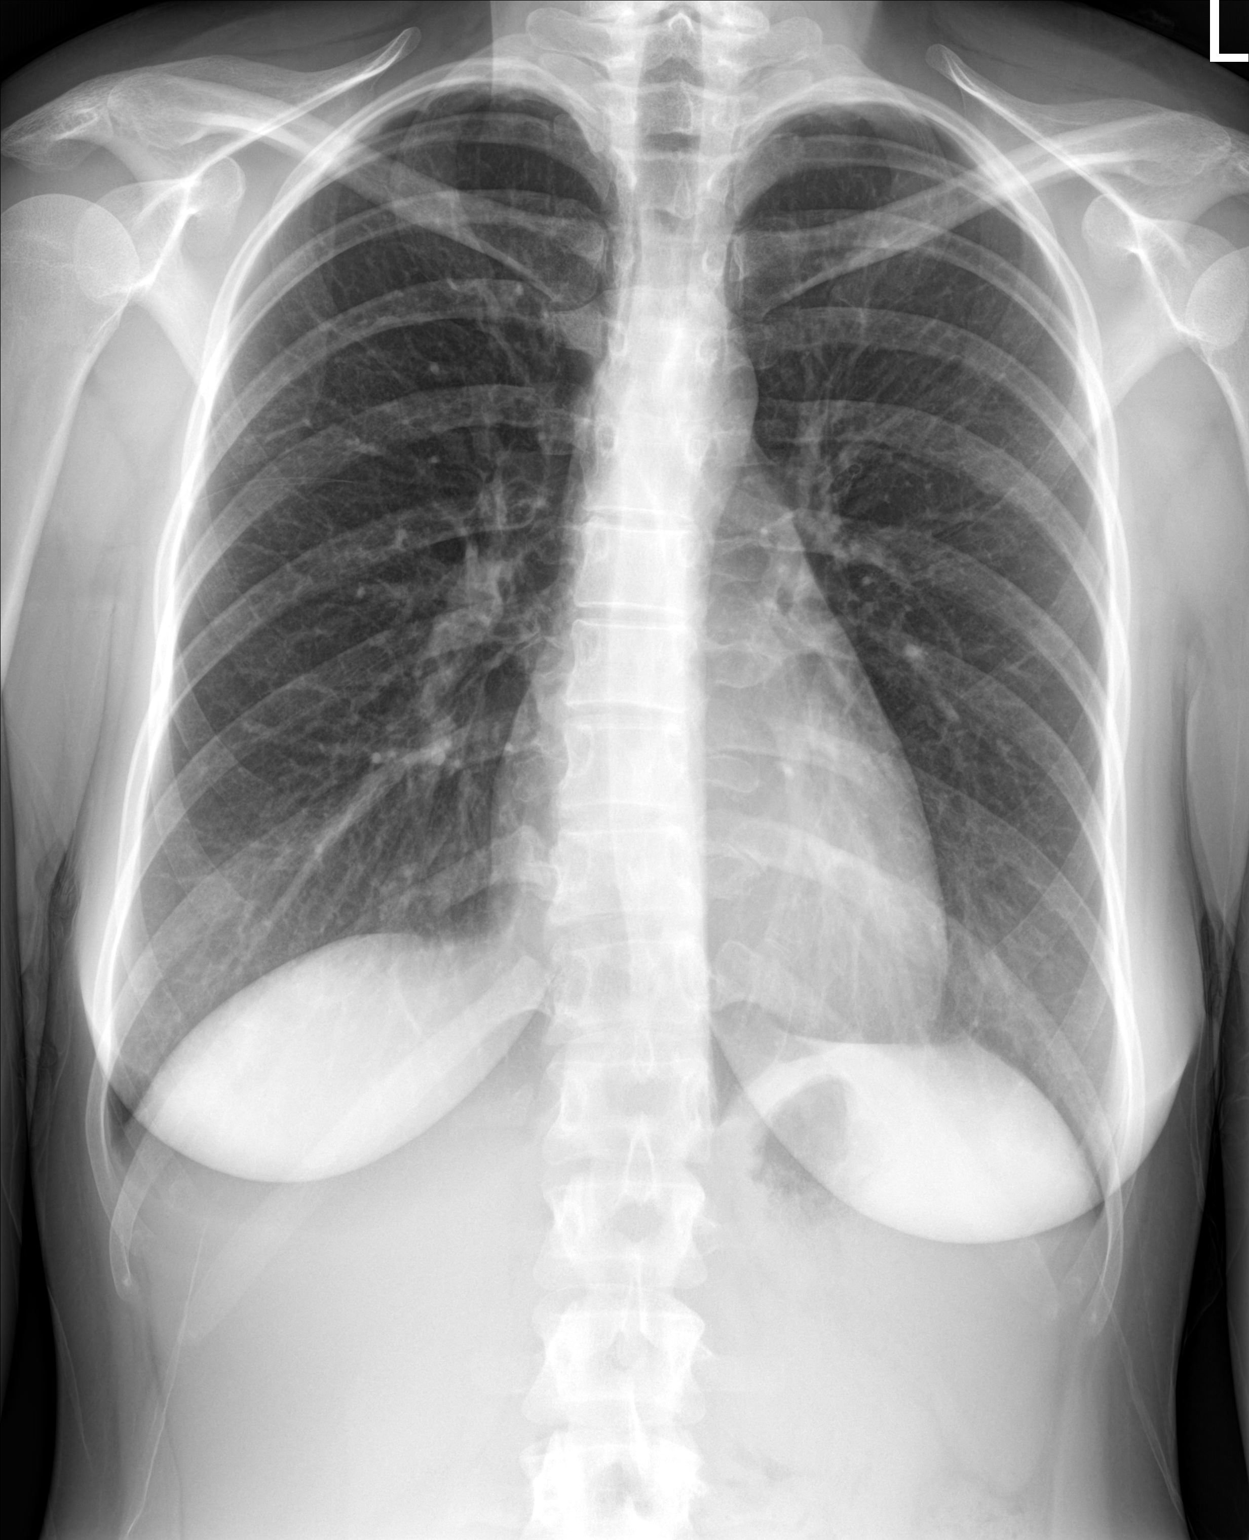

[chest lat]
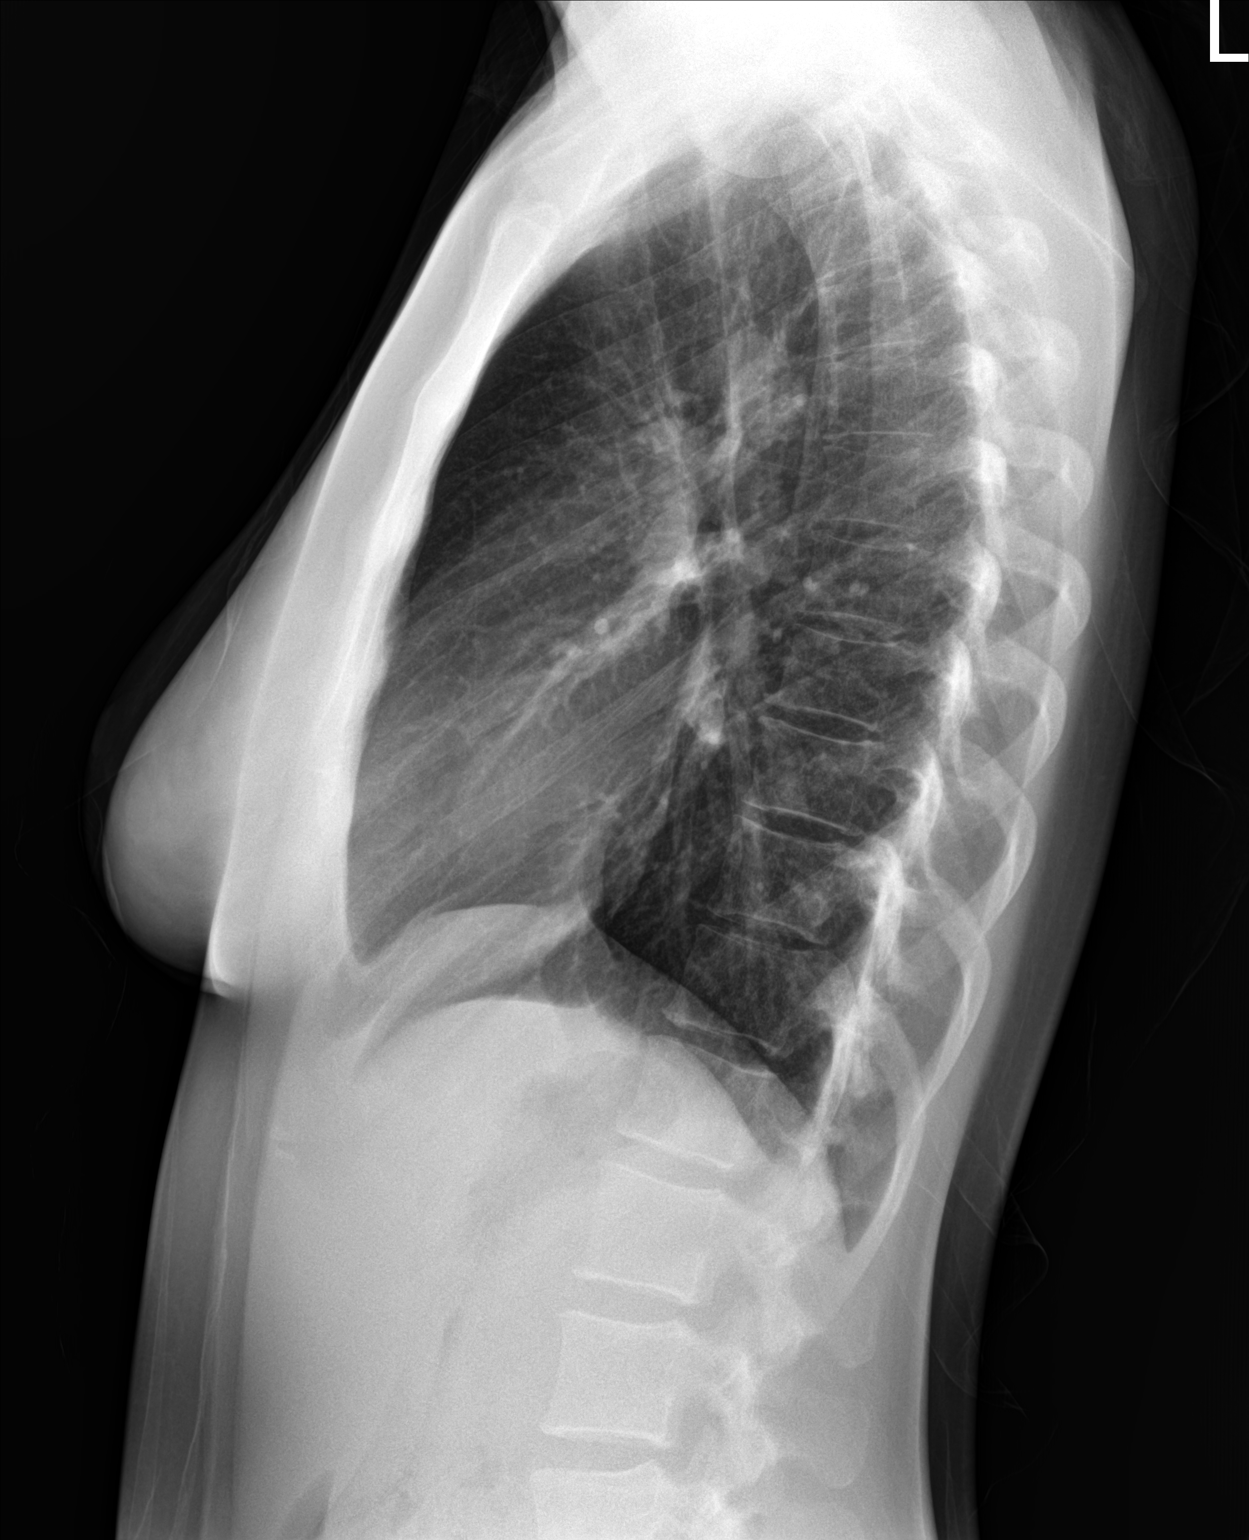

[2 of 2 positions shown; findings below may reference images not displayed]

FINDINGS: The heart size and mediastinal contours are within normal limits.
Both lungs are clear. The visualized skeletal structures are
unremarkable.
IMPRESSION: No active cardiopulmonary disease.

## 2023-03-03 DIAGNOSIS — H6502 Acute serous otitis media, left ear: Secondary | ICD-10-CM | POA: Diagnosis not present

## 2023-03-03 DIAGNOSIS — J101 Influenza due to other identified influenza virus with other respiratory manifestations: Secondary | ICD-10-CM | POA: Diagnosis not present

## 2023-03-03 DIAGNOSIS — Z20822 Contact with and (suspected) exposure to covid-19: Secondary | ICD-10-CM | POA: Diagnosis not present

## 2023-03-03 DIAGNOSIS — R0982 Postnasal drip: Secondary | ICD-10-CM | POA: Diagnosis not present

## 2023-03-03 DIAGNOSIS — R070 Pain in throat: Secondary | ICD-10-CM | POA: Diagnosis not present

## 2023-04-13 ENCOUNTER — Encounter: Payer: Self-pay | Admitting: Family

## 2023-04-13 ENCOUNTER — Other Ambulatory Visit: Payer: Medicaid Other

## 2023-04-13 ENCOUNTER — Ambulatory Visit: Payer: Medicaid Other | Admitting: Family

## 2023-04-13 VITALS — BP 120/80 | HR 85 | Temp 98.2°F | Ht 61.0 in | Wt 132.8 lb

## 2023-04-13 DIAGNOSIS — R079 Chest pain, unspecified: Secondary | ICD-10-CM

## 2023-04-13 DIAGNOSIS — R002 Palpitations: Secondary | ICD-10-CM

## 2023-04-13 DIAGNOSIS — R0602 Shortness of breath: Secondary | ICD-10-CM | POA: Diagnosis not present

## 2023-04-13 DIAGNOSIS — R0789 Other chest pain: Secondary | ICD-10-CM | POA: Diagnosis not present

## 2023-04-13 LAB — LIPID PANEL

## 2023-04-13 MED ORDER — IBUPROFEN 800 MG PO TABS: 800.0000 mg | ORAL_TABLET | Freq: Three times a day (TID) | ORAL | 2 refills | Status: DC | PRN

## 2023-04-13 NOTE — Progress Notes (Signed)
 Subjective:    Patient ID: Vanessa Villanueva, female    DOB: 12-22-1990, 33 y.o.   MRN: [id number]  Chief Complaint  Patient presents with   Chest Pain    Left arm pain. Wants rx for IBP 800mg  only thing that helps. Updated letter to not wear seat belt     Chest Pain    PT presents to the office today with recurrent chest pain that radiates down her left arm that started 2023.  Reports she has tenderness to her chest and back with palpation. Denies any injury.    She had cholecystomy in May 2023 and thought this was causing her chest pain. After that her abdominal pain improved, but not her chest pain.   Reports ibuprofen  and heat helps with the pain. Reports the pain is constant pain is 6-7 out 10, but when laying down or lifting anything it is a 8 out 10.   Denies edema. Reports SOB with walking and when she picks up items. She has hx of iron deficiency and Vit B 12 and followed by Hematologists. These were normal last time they were checked.    Review of Systems  Cardiovascular:  Positive for chest pain.  All other systems reviewed and are negative.   Social History   Socioeconomic History   Marital status: Married    Spouse name: Not on file   Number of children: Not on file   Years of education: Not on file   Highest education level: Not on file  Occupational History   Not on file  Tobacco Use   Smoking status: Never   Smokeless tobacco: Never  Vaping Use   Vaping status: Every Day  Substance and Sexual Activity   Alcohol use: No   Drug use: No   Sexual activity: Yes    Birth control/protection: Surgical  Other Topics Concern   Not on file  Social History Narrative   Not on file   Social Drivers of Health   Financial Resource Strain: Low Risk  (08/07/2022)   Overall Financial Resource Strain (CARDIA)    Difficulty of Paying Living Expenses: Not hard at all  Food Insecurity: No Food Insecurity (08/07/2022)   Hunger Vital Sign    Worried About Running Out  of Food in the Last Year: Never true    Ran Out of Food in the Last Year: Never true  Transportation Needs: Patient Declined (08/07/2022)   PRAPARE - Transportation    Lack of Transportation (Medical): Patient declined    Lack of Transportation (Non-Medical): Patient declined  Physical Activity: Unknown (08/07/2022)   Exercise Vital Sign    Days of Exercise per Week: Patient declined    Minutes of Exercise per Session: Not on file  Stress: No Stress Concern Present (08/07/2022)   [employer] of Occupational Health - Occupational Stress Questionnaire    Feeling of Stress : Not at all  Social Connections: Unknown (08/07/2022)   Social Connection and Isolation Panel [NHANES]    Frequency of Communication with Friends and Family: Patient declined    Frequency of Social Gatherings with Friends and Family: Patient declined    Attends Religious Services: Patient declined    Database administrator or Organizations: Patient declined    Attends Banker Meetings: Not on file    Marital Status: Patient declined   Family History  Problem Relation Age of Onset   Diabetes Mother    COPD Mother    Mental illness Father  Bipolar, schizophenia        Objective:   Physical Exam Vitals reviewed.  Constitutional:      General: She is not in acute distress.    Appearance: She is well-developed.  HENT:     Head: Normocephalic and atraumatic.     Right Ear: Tympanic membrane normal.     Left Ear: Tympanic membrane normal.  Eyes:     Pupils: Pupils are equal, round, and reactive to light.  Neck:     Thyroid : No thyromegaly.  Cardiovascular:     Rate and Rhythm: Normal rate. Rhythm irregular.     Heart sounds: Normal heart sounds. No murmur heard. Pulmonary:     Effort: Pulmonary effort is normal. No respiratory distress.     Breath sounds: Normal breath sounds. No wheezing.  Abdominal:     General: Bowel sounds are normal. There is no distension.     Palpations: Abdomen  is soft.     Tenderness: There is no abdominal tenderness.  Musculoskeletal:        General: No tenderness. Normal range of motion.     Cervical back: Normal range of motion and neck supple.  Skin:    General: Skin is warm and dry.  Neurological:     Mental Status: She is alert and oriented to person, place, and time.     Cranial Nerves: No cranial nerve deficit.     Deep Tendon Reflexes: Reflexes are normal and symmetric.  Psychiatric:        Behavior: Behavior normal.        Thought Content: Thought content normal.        Judgment: Judgment normal.       BP 120/80   Pulse 85   Temp 98.2 F (36.8 C) (Temporal)   Ht 5\' 1"  (1.549 m)   Wt 132 lb 12.8 oz (60.2 kg)   SpO2 99%   BMI 25.09 kg/m      Assessment & Plan:  [name] comes in today with chief complaint of Chest Pain (Left arm pain. Wants rx for IBP 800mg  only thing that helps. Updated letter to not wear seat belt )   Diagnosis and orders addressed:  1. Chest pain, unspecified type (Primary) - Ambulatory referral to Cardiology  2. SOB (shortness of breath) on exertion - Ambulatory referral to Cardiology  3. Palpitations - EKG 12-Lead - Ambulatory referral to Cardiology  4. Chest wall pain - Ambulatory referral to Physical Therapy - ibuprofen  (ADVIL ) 800 MG tablet; Take 1 tablet (800 mg total) by mouth every 8 (eight) hours as needed.  Dispense: 90 tablet; Refill: 2   Labs reviewed  Continue current medications  New SOB with exertion and having palpitations will do referral to Cardiologists for work up. Heart rate irregular on exam, will do EKG and Holter monitor today.  Continue ibuprofen  800 mg TID prn  Heating pad as needed  Referral to PT  Keep chronic follow up Approx 40 mins spent with patient, chart review, and education.     Tommas Fragmin, FNP

## 2023-04-13 NOTE — Patient Instructions (Signed)
 Chest Wall Pain Chest wall pain is pain in or around the bones and muscles of your chest. Sometimes, an injury causes this pain. Excessive coughing or overuse of arm and chest muscles may also cause chest wall pain. Sometimes, the cause may not be known. This pain may take several weeks or longer to get better. Follow these instructions at home: Managing pain, stiffness, and swelling  If directed, put ice on the painful area: Put ice in a plastic bag. Place a towel between your skin and the bag. Leave the ice on for 20 minutes, 2-3 times per day. Activity Rest as told by your health care provider. Avoid activities that cause pain. These include any activities that use your chest muscles or your abdominal and side muscles to lift heavy items. Ask your health care provider what activities are safe for you. General instructions  Take over-the-counter and prescription medicines only as told by your health care provider. Do not use any products that contain nicotine or tobacco, such as cigarettes, e-cigarettes, and chewing tobacco. These can delay healing after injury. If you need help quitting, ask your health care provider. Keep all follow-up visits as told by your health care provider. This is important. Contact a health care provider if: You have a fever. Your chest pain becomes worse. You have new symptoms. Get help right away if: You have nausea or vomiting. You feel sweaty or light-headed. You have a cough with mucus from your lungs (sputum) or you cough up blood. You develop shortness of breath. These symptoms may represent a serious problem that is an emergency. Do not wait to see if the symptoms will go away. Get medical help right away. Call your local emergency services (911 in the [place] Do not drive yourself to the hospital. Summary Chest wall pain is pain in or around the bones and muscles of your chest. Depending on the cause, it may be treated with ice, rest, medicines, and  avoiding activities that cause pain. Contact a health care provider if you have a fever, worsening chest pain, or new symptoms. Get help right away if you feel light-headed or you develop shortness of breath. These symptoms may be an emergency. This information is not intended to replace advice given to you by your health care provider. Make sure you discuss any questions you have with your health care provider. Document Revised: 02/10/2022 Document Reviewed: 02/10/2022 Elsevier Patient Education  2024 [employer].

## 2023-04-14 LAB — CBC WITH DIFFERENTIAL/PLATELET
Basophils Absolute: 0.1 10*3/uL (ref 0.0–0.2)
Basos: 1 %
EOS (ABSOLUTE): 0.3 10*3/uL (ref 0.0–0.4)
Eos: 5 %
Hematocrit: 38.4 % (ref 34.0–46.6)
Hemoglobin: 12.3 g/dL (ref 11.1–15.9)
Immature Grans (Abs): 0 10*3/uL (ref 0.0–0.1)
Immature Granulocytes: 0 %
Lymphocytes Absolute: 1.9 10*3/uL (ref 0.7–3.1)
Lymphs: 29 %
MCH: 31.7 pg (ref 26.6–33.0)
MCHC: 32 g/dL (ref 31.5–35.7)
MCV: 99 fL — ABNORMAL HIGH (ref 79–97)
Monocytes Absolute: 0.5 10*3/uL (ref 0.1–0.9)
Monocytes: 8 %
Neutrophils Absolute: 3.9 10*3/uL (ref 1.4–7.0)
Neutrophils: 57 %
Platelets: 302 10*3/uL (ref 150–450)
RBC: 3.88 x10E6/uL (ref 3.77–5.28)
RDW: 11.9 % (ref 11.7–15.4)
WBC: 6.7 10*3/uL (ref 3.4–10.8)

## 2023-04-14 LAB — CMP14+EGFR
ALT: 14 IU/L (ref 0–32)
AST: 16 IU/L (ref 0–40)
Albumin: 4.4 g/dL (ref 3.9–4.9)
Alkaline Phosphatase: 62 IU/L (ref 44–121)
BUN/Creatinine Ratio: 12 (ref 9–23)
BUN: 8 mg/dL (ref 6–20)
Bilirubin Total: 1.4 mg/dL — ABNORMAL HIGH (ref 0.0–1.2)
CO2: 21 mmol/L (ref 20–29)
Calcium: 9.5 mg/dL (ref 8.7–10.2)
Chloride: 104 mmol/L (ref 96–106)
Creatinine, Ser: 0.66 mg/dL (ref 0.57–1.00)
Globulin, Total: 2.4 g/dL (ref 1.5–4.5)
Glucose: 81 mg/dL (ref 70–99)
Potassium: 4.3 mmol/L (ref 3.5–5.2)
Sodium: 139 mmol/L (ref 134–144)
Total Protein: 6.8 g/dL (ref 6.0–8.5)
eGFR: 118 mL/min/{1.73_m2} (ref >59)

## 2023-04-14 LAB — LIPID PANEL
Cholesterol, Total: 232 mg/dL — ABNORMAL HIGH (ref 100–199)
HDL: 68 mg/dL (ref >39)
LDL CALC COMMENT:: 3.4 ratio (ref 0.0–4.4)
LDL Chol Calc (NIH): 142 mg/dL — ABNORMAL HIGH (ref 0–99)
Triglycerides: 125 mg/dL (ref 0–149)
VLDL Cholesterol Cal: 22 mg/dL (ref 5–40)

## 2023-04-14 LAB — TSH: TSH: 1.41 u[IU]/mL (ref 0.450–4.500)

## 2023-04-22 ENCOUNTER — Other Ambulatory Visit: Payer: Self-pay

## 2023-04-22 ENCOUNTER — Ambulatory Visit: Payer: Medicaid Other | Attending: Family

## 2023-04-22 DIAGNOSIS — M546 Pain in thoracic spine: Secondary | ICD-10-CM | POA: Insufficient documentation

## 2023-04-22 DIAGNOSIS — M79602 Pain in left arm: Secondary | ICD-10-CM | POA: Insufficient documentation

## 2023-04-22 DIAGNOSIS — R0789 Other chest pain: Secondary | ICD-10-CM | POA: Insufficient documentation

## 2023-04-22 NOTE — Therapy (Signed)
 OUTPATIENT PHYSICAL THERAPY THORACIC EVALUATION   Patient Name: Vanessa Villanueva MRN: [id number] DOB:03-03-1991, 33 y.o., female Today's Date: 04/22/2023  END OF SESSION:  PT End of Session - 04/22/23 0919     Visit Number 1    Number of Visits 1    Date for PT Re-Evaluation 04/23/23    PT Start Time 0930    PT Stop Time 0948    PT Time Calculation (min) 18 min    Activity Tolerance Patient tolerated treatment well    Behavior During Therapy [facility] for tasks assessed/performed             Past Medical History:  Diagnosis Date   Anemia    Vitamin B12 deficiency 04/20/2021   Past Surgical History:  Procedure Laterality Date   TUBAL LIGATION     Patient Active Problem List   Diagnosis Date Noted   Vitamin B12 deficiency 04/20/2021   Iron deficiency anemia 02/15/2021   Chest pain 01/07/2021   Chronic constipation 01/07/2021   Anemia 01/07/2021   Flank pain 09/21/2020   GAD (generalized anxiety disorder) 08/30/2020   Depression, major, single episode, moderate (HCC) 08/30/2020   Pelvic pain 08/19/2019   REFERRING PROVIDER: Junie Spencer, FNP   REFERRING DIAG: Chest wall pain   Rationale for Evaluation and Treatment: Rehabilitation  THERAPY DIAG:  Pain in thoracic spine  Pain in left arm  ONSET DATE: 2022  SUBJECTIVE:                                                                                                                                                                                           SUBJECTIVE STATEMENT: Patient reports that she has been having chest pain since 2022. She had an EKG which was abnormal, but her referring physician still wanted her to come to physical therapy. She scheduled to see a cardiologist on 06/10/23, but she is on the wait list if they get a cancellation sooner than that. She notes that her pain gets worse when she lifts with her left arm, walks fast or for long distances, and stairs. She has never had any neck, back, or  arm problems prior to this pain. She has intermittent numbness when she crosses her legs while sitting that will radiate up both legs to her hips.   PERTINENT HISTORY:  Anxiety  PAIN:  Are you having pain? Yes: NPRS scale: Current: 6/10 Best: 6/10 Worst: 10+/10 Pain location: chest, left shoulder and arm, and mid back Pain description: "feels like my heart is skipping a beat", pressure, stabbing, aching Aggravating factors: walking, lifting, stairs Relieving factors: rest, heat  PRECAUTIONS: None  RED FLAGS: None   WEIGHT BEARING RESTRICTIONS: No  FALLS:  Has patient fallen in last 6 months? No  LIVING ENVIRONMENT: Lives with: lives with their family Lives in: House/apartment Stairs:  Yes, but she primarily uses a ramp Has following equipment at home: Ramped entry  PLOF: Independent  PATIENT GOALS: reduced pain  NEXT MD VISIT: none scheduled  OBJECTIVE:  Note: Objective measures were completed at Evaluation unless otherwise noted.  COGNITION: Overall cognitive status: Within functional limits for tasks assessed     SENSATION: Patient reports intermittent numbness and tingling, but none currently  POSTURE: rounded shoulders, forward head, and flexed trunk   PALPATION: No tenderness to palpation  LUMBAR ROM: unable to assess  UPPER EXTREMITY ROM: patient avoids using her LUE due to pain in her chest when reaching overhead  UPPER AND  LOWER EXTREMITY MMT:  unable to assess  GAIT: Assistive device utilized: None Level of assistance: Complete Independence Comments: no significant gait deviations observed  TREATMENT DATE:                                                                                                                                  PATIENT EDUCATION:  Education details: follow up with cardiologist Person educated: Patient Education method: Explanation Education comprehension: verbalized understanding  HOME EXERCISE  PROGRAM:   ASSESSMENT:  CLINICAL IMPRESSION: Patient is a 33 y.o. female who was seen today for physical therapy evaluation and treatment for chronic chest, left upper extremity, and thoracic pain. She presented with moderate to hight pain severity and irritability with none of today's assessments able to reproduce her familiar symptoms. Recommend that she follow up with her cardiologist to rule out cardiac dysfunction prior to skilled physical therapy.    OBJECTIVE IMPAIRMENTS: impaired sensation, postural dysfunction, and pain.   ACTIVITY LIMITATIONS: carrying, lifting, stairs, and reach over head  PARTICIPATION LIMITATIONS: cleaning, shopping, and community activity  PERSONAL FACTORS: Past/current experiences, Time since onset of injury/illness/exacerbation, and 1 comorbidity: anxiety  are also affecting patient's functional outcome.   REHAB POTENTIAL: Poor    CLINICAL DECISION MAKING: Evolving/moderate complexity  EVALUATION COMPLEXITY: Moderate   GOALS: Goals reviewed with patient? No  PLAN:  PT FREQUENCY: one time visit  PT DURATION: 1 week  PLANNED INTERVENTIONS: 97164- PT Re-evaluation, 97110-Therapeutic exercises, 97530- Therapeutic activity, 97112- Neuromuscular re-education, 97535- Self Care, 16109- Manual therapy, Patient/Family education, Taping, Joint mobilization, Spinal mobilization, Cryotherapy, and Moist heat.  PLAN FOR NEXT SESSION: patient will be discharged from physical therapy to follow up with the cardiologist   Granville Lewis, PT 04/22/2023, 10:13 AM

## 2023-05-08 ENCOUNTER — Other Ambulatory Visit: Payer: Self-pay | Admitting: Family

## 2023-05-08 DIAGNOSIS — I471 Supraventricular tachycardia, unspecified: Secondary | ICD-10-CM

## 2023-05-15 ENCOUNTER — Inpatient Hospital Stay: Payer: Medicaid Other

## 2023-05-18 ENCOUNTER — Inpatient Hospital Stay: Attending: Hematology

## 2023-05-18 DIAGNOSIS — D500 Iron deficiency anemia secondary to blood loss (chronic): Secondary | ICD-10-CM

## 2023-05-18 DIAGNOSIS — D509 Iron deficiency anemia, unspecified: Secondary | ICD-10-CM | POA: Insufficient documentation

## 2023-05-18 DIAGNOSIS — E538 Deficiency of other specified B group vitamins: Secondary | ICD-10-CM | POA: Diagnosis not present

## 2023-05-18 LAB — FERRITIN: Ferritin: 138 ng/mL (ref 11–307)

## 2023-05-18 LAB — CBC WITH DIFFERENTIAL/PLATELET
Abs Immature Granulocytes: 0.02 10*3/uL (ref 0.00–0.07)
Basophils Absolute: 0 10*3/uL (ref 0.0–0.1)
Basophils Relative: 1 %
Eosinophils Absolute: 0.4 10*3/uL (ref 0.0–0.5)
Eosinophils Relative: 4 %
HCT: 39.3 % (ref 36.0–46.0)
Hemoglobin: 12.1 g/dL (ref 12.0–15.0)
Immature Granulocytes: 0 %
Lymphocytes Relative: 26 %
Lymphs Abs: 2.3 10*3/uL (ref 0.7–4.0)
MCH: 31.1 pg (ref 26.0–34.0)
MCHC: 30.8 g/dL (ref 30.0–36.0)
MCV: 101 fL — ABNORMAL HIGH (ref 80.0–100.0)
Monocytes Absolute: 0.5 10*3/uL (ref 0.1–1.0)
Monocytes Relative: 6 %
Neutro Abs: 5.5 10*3/uL (ref 1.7–7.7)
Neutrophils Relative %: 63 %
Platelets: 320 10*3/uL (ref 150–400)
RBC: 3.89 MIL/uL (ref 3.87–5.11)
RDW: 12.5 % (ref 11.5–15.5)
WBC: 8.7 10*3/uL (ref 4.0–10.5)
nRBC: 0 % (ref 0.0–0.2)

## 2023-05-18 LAB — IRON AND TIBC
Iron: 117 ug/dL (ref 28–170)
Saturation Ratios: 30 % (ref 10.4–31.8)
TIBC: 392 ug/dL (ref 250–450)
UIBC: 275 ug/dL

## 2023-05-18 LAB — VITAMIN B12: Vitamin B-12: 367 pg/mL (ref 180–914)

## 2023-05-21 LAB — METHYLMALONIC ACID, SERUM: Methylmalonic Acid, Quantitative: 114 nmol/L (ref 0–378)

## 2023-05-22 ENCOUNTER — Inpatient Hospital Stay: Payer: Medicaid Other | Admitting: Oncology

## 2023-05-22 DIAGNOSIS — D500 Iron deficiency anemia secondary to blood loss (chronic): Secondary | ICD-10-CM | POA: Diagnosis not present

## 2023-05-22 DIAGNOSIS — E538 Deficiency of other specified B group vitamins: Secondary | ICD-10-CM

## 2023-05-22 DIAGNOSIS — D509 Iron deficiency anemia, unspecified: Secondary | ICD-10-CM | POA: Diagnosis not present

## 2023-05-22 NOTE — Progress Notes (Signed)
 [facility]   REASON FOR VISIT: Iron deficiency anemia  PRIOR THERAPY: Iron tablets  CURRENT THERAPY: IV Feraheme (last given 02/21/2021 and 03/08/2021)  INTERVAL HISTORY:  Vanessa Villanueva is here for follow-up.  She is followed by hematology for iron deficiency anemia.  She was last evaluated in clinic a year ago.  She has received intermittent IV iron last given on 02/21/2021 and 03/08/2021.  She denies any recent hospitalizations, surgeries or changes to her baseline health.  Had gallbladder removed in May 2023.   Reports she takes 1000 mcg B12 daily along with 325 mg ferrous sulfate.  She tolerates both well.  Developed left arm pain last year. Saw PCP shortly after and was told to eat low fat diet. Changed her diet and has only been eating fruits and vegetables with occasional chicken without improvement of her cholesterol.  Reports she had a Holter monitor for 2 weeks which showed some SVT and elevated heart rate.  She has cardiology ointment on June 09, 2023.  Reports she tires easily.  Some numbness and tingling in lower and upper extremities.  She denies any bleeding. Has some light spotting every 3 to 4 months when she was placed on birth control. She denies any hematochezia or melena.  She denies any pica, restless legs, chest pain, dyspnea on exertion, lightheadedness, or syncope.  She is taking iron tablets and B12 tablets daily.   She reports 25% energy and 75% appetite.    REVIEW OF SYSTEMS:   Review of Systems  Respiratory:  Positive for shortness of breath.   Cardiovascular:  Positive for chest pain and palpitations.  Gastrointestinal:  Positive for nausea.  Neurological:  Positive for dizziness and headaches.  Psychiatric/Behavioral:  The patient is nervous/anxious and has insomnia.     PHYSICAL EXAM:   Physical Exam Constitutional:      Appearance: Normal appearance.  Cardiovascular:     Rate and Rhythm: Normal rate and regular rhythm.   Pulmonary:     Effort: Pulmonary effort is normal.     Breath sounds: Normal breath sounds.  Abdominal:     General: Bowel sounds are normal.     Palpations: Abdomen is soft.  Musculoskeletal:        General: No swelling. Normal range of motion.  Neurological:     Mental Status: She is alert and oriented to person, place, and time. Mental status is at baseline.     ASSESSMENT & PLAN:  1.  Iron deficiency anemia - Patient seen at the request of Dr. Levon Hedger for iron deficiency with labs from 01/07/2021 showing ferritin 6, hemoglobin 10.8 and MCV 88.6. - Menstrual cycles used to be heavy, but she has not had a period since she started birth control in February 2023 (mild spotting every few months) - She denies any bright red blood per rectum or melena    - No history of blood transfusions - She received IV Feraheme x2 on 02/21/2021 on 03/08/2021.  She takes iron supplement daily. - Most recent labs (05/18/23): Hgb 12.1/MCV 101.0, ferritin 138, iron saturation 30% - We will continue with oral iron supplementation for the time being, since she is no longer having menorrhagia. - Repeat labs in 1 year with office visit to follow.   2.  Vitamin B12 deficiency - Labs checked by PCP (04/19/2021) show vitamin B12 low at 142 - She was started on vitamin B12 500 mcg daily by her PCP - Labs (07/04/2021) showed improved  but marginal vitamin B12 at 210 - She is now taking vitamin B12 1000 mcg daily since May 2023.  - Most recent labs (05/18/2023) showed vitamin B12 367 and normal methylmalonic acid - Recommend increase  B12 2,000 mcg daily.  - We will check B12/methylmalonic acid in 1 year followed by office visit  3.  Social/family history: - She lives at home with her husband.  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  She does vaping. - Sister had anemia.  No cancer in the family.  Her mother was adopted.    PLAN SUMMARY: >> Labs in 1 year = CBC/D, ferritin, iron/TIBC, B12, MMA >> OFFICE visit in  1 year (1 week after labs)     I discussed the assessment and treatment plan with the patient. The patient was provided an opportunity to ask questions and all were answered. The patient agreed with the plan and demonstrated an understanding of the instructions.   The patient was advised to call back or seek an in-person evaluation if the symptoms worsen or if the condition fails to improve as anticipated.  I spent 20 minutes dedicated to the care of this patient (face-to-face and non-face-to-face) on the date of the encounter to include what is described in the assessment and plan.  Durenda Hurt, NP 05/22/2023 10:25 AM

## 2023-05-27 ENCOUNTER — Encounter: Payer: Self-pay | Admitting: Internal Medicine

## 2023-05-27 DIAGNOSIS — R079 Chest pain, unspecified: Secondary | ICD-10-CM | POA: Diagnosis not present

## 2023-05-27 DIAGNOSIS — R0789 Other chest pain: Secondary | ICD-10-CM | POA: Diagnosis not present

## 2023-05-27 DIAGNOSIS — R0602 Shortness of breath: Secondary | ICD-10-CM | POA: Diagnosis not present

## 2023-05-27 NOTE — Telephone Encounter (Signed)
error 

## 2023-06-10 ENCOUNTER — Encounter: Payer: Self-pay | Admitting: Internal Medicine

## 2023-06-10 ENCOUNTER — Ambulatory Visit: Payer: Medicaid Other | Attending: Internal Medicine | Admitting: Internal Medicine

## 2023-06-10 VITALS — BP 108/68 | HR 87 | Ht 61.0 in | Wt 131.4 lb

## 2023-06-10 DIAGNOSIS — E785 Hyperlipidemia, unspecified: Secondary | ICD-10-CM | POA: Insufficient documentation

## 2023-06-10 DIAGNOSIS — R0609 Other forms of dyspnea: Secondary | ICD-10-CM | POA: Insufficient documentation

## 2023-06-10 DIAGNOSIS — R0602 Shortness of breath: Secondary | ICD-10-CM

## 2023-06-10 DIAGNOSIS — E7849 Other hyperlipidemia: Secondary | ICD-10-CM | POA: Diagnosis not present

## 2023-06-10 DIAGNOSIS — R079 Chest pain, unspecified: Secondary | ICD-10-CM | POA: Diagnosis not present

## 2023-06-10 MED ORDER — ROSUVASTATIN CALCIUM 5 MG PO TABS: 5.0000 mg | ORAL_TABLET | Freq: Every day | ORAL | 5 refills | Status: AC

## 2023-06-10 NOTE — Patient Instructions (Signed)
 Medication Instructions:  Your physician recommends that you continue on your current medications as directed. Please refer to the Current Medication list given to you today.   Labwork: BNP to be completed at [facility] today  Testing/Procedures: Your physician has requested that you have an exercise tolerance test. For further information please visit [email or web address] Please also follow instruction sheet, as given.  Your physician has requested that you have an echocardiogram. Echocardiography is a painless test that uses sound waves to create images of your heart. It provides your doctor with information about the size and shape of your heart and how well your heart's chambers and valves are working. This procedure takes approximately one hour. There are no restrictions for this procedure. Please do NOT wear cologne, perfume, aftershave, or lotions (deodorant is allowed). Please arrive 15 minutes prior to your appointment time.  Please note: We ask at that you not bring children with you during ultrasound (echo/ vascular) testing. Due to room size and safety concerns, children are not allowed in the ultrasound rooms during exams. Our front office staff cannot provide observation of children in our lobby area while testing is being conducted. An adult accompanying a patient to their appointment will only be allowed in the ultrasound room at the discretion of the ultrasound technician under special circumstances. We apologize for any inconvenience.   Follow-Up: Your physician recommends that you schedule a follow-up appointment in:   Any Other Special Instructions Will Be Listed Below (If Applicable). Thank you for choosing [facility]!     If you need a refill on your cardiac medications before your next appointment, please call your pharmacy.

## 2023-06-10 NOTE — Progress Notes (Signed)
 Cardiology Office Note  Date: 06/10/2023   ID: Vanessa, Villanueva [birth year], MRN [id number]  PCP:  Vanessa Spencer, FNP  Cardiologist:  Marjo Bicker, MD Electrophysiologist:  None   History of Present Illness: Vanessa Villanueva is a 33 y.o. female with no PMH was referred to cardiology clinic for evaluation chest pain.  Ongoing symptoms of chest pain, DOE, dizziness, palpitations with exertion x month/year.  Palpitations occur at rest and exertion.  Orthostatics performed today in the clinic were negative for orthostatic hypotension and POTS.  Currently on low-cholesterol/low-fat diet.  Switch to 1 year ago.  Adequately hydrated and eats enough salt.  No syncope.  Past Medical History:  Diagnosis Date   Anemia    Vitamin B12 deficiency 04/20/2021    Past Surgical History:  Procedure Laterality Date   TUBAL LIGATION      Current Outpatient Medications  Medication Sig Dispense Refill   EPINEPHrine 0.3 mg/0.3 mL IJ SOAJ injection Inject 0.3 mg into the muscle as needed for anaphylaxis. 1 each 1   famotidine (PEPCID) 20 MG tablet Take 20 mg by mouth 2 (two) times daily.     ferrous sulfate 325 (65 FE) MG tablet Take 1 tablet (325 mg total) by mouth daily with breakfast. 90 tablet 3   ibuprofen (ADVIL) 800 MG tablet Take 1 tablet (800 mg total) by mouth every 8 (eight) hours as needed. 90 tablet 2   levonorgestrel-ethinyl estradiol (LYBREL) 90-20 MCG tablet TAKE 1 TABLET BY MOUTH EVERY DAY 84 tablet 1   rosuvastatin (CRESTOR) 5 MG tablet Take 1 tablet (5 mg total) by mouth daily. 30 tablet 5   vitamin B-12 (CYANOCOBALAMIN) 500 MCG tablet Take 500 mcg by mouth daily.     No current facility-administered medications for this visit.   Allergies:  Codeine   Social History: The patient  reports that she has never smoked. She has never used smokeless tobacco. She reports that she does not drink alcohol and does not use drugs.   Family History: The patient's family  history includes COPD in her mother; Diabetes in her mother; Mental illness in her father.   ROS:  Please see the history of present illness. Otherwise, complete review of systems is positive for none  All other systems are reviewed and negative.   Physical Exam: VS:  BP 108/68   Pulse 87   Ht 5\' 1"  (1.549 m)   Wt 131 lb 6.4 oz (59.6 kg)   SpO2 99%   BMI 24.83 kg/m , BMI Body mass index is 24.83 kg/m.  Wt Readings from Last 3 Encounters:  06/10/23 131 lb 6.4 oz (59.6 kg)  05/22/23 130 lb 11.7 oz (59.3 kg)  04/13/23 132 lb 12.8 oz (60.2 kg)    General: Patient appears comfortable at rest. HEENT: Conjunctiva and lids normal, oropharynx clear with moist mucosa. Neck: Supple, no elevated JVP or carotid bruits, no thyromegaly. Lungs: Clear to auscultation, nonlabored breathing at rest. Cardiac: Regular rate and rhythm, no S3 or significant systolic murmur, no pericardial rub. Abdomen: Soft, nontender, no hepatomegaly, bowel sounds present, no guarding or rebound. Extremities: No pitting edema, distal pulses 2+. Skin: Warm and dry. Musculoskeletal: No kyphosis. Neuropsychiatric: Alert and oriented x3, affect grossly appropriate.  Recent Labwork: 04/13/2023: ALT 14; AST 16; BUN 8; Creatinine, Ser 0.66; Potassium 4.3; Sodium 139; TSH 1.410 05/18/2023: Hemoglobin 12.1; Platelets 320     Component Value Date/Time   CHOL 232 (H) 04/13/2023 0941   TRIG 125  04/13/2023 0941   HDL 68 04/13/2023 0941   CHOLHDL 3.4 04/13/2023 0941   LDLCALC 142 (H) 04/13/2023 0941     Assessment and Plan:  Patient is a 33 year old F with no PMH was referred to cardiology clinic for evaluation of chest pain.  Ongoing symptoms of chest pain, SOB, dizziness with exertion for many months/a year.  She also has palpitations both at rest and exertion. reports adequate hydration and liberal salt intake.  I discussed the event monitor results with the patient today in the clinic visit.  Event monitor is  unremarkable.  Her symptoms correlated with NSR, PAC and PVC.  She did have 2 runs of nonsustained SVT, fastest lasting 1.5 seconds and the longest lasting 6 seconds.  This is benign, provided reassurance.  For chest pain and DOE with elevated proBNP, will obtain ETT and echocardiogram.  Unclear if this proBNP is accurate.  Will obtain BNP instead.  Orthostatic vitals today in the clinic were negative for POTS and orthostatic hypotension.  I reviewed her lipid panel that showed elevated LDL, 142.  Her previous LDL levels were 120s.  She wanted to be on statins and is extremely worried about her risk of heart disease due to elevated LDL.  Will start rosuvastatin 5 mg at bedtime. Strongly encouraged her to switch from low-fat/low-cholesterol diet to regular diet and exercise.      Medication Adjustments/Labs and Tests Ordered: Current medicines are reviewed at length with the patient today.  Concerns regarding medicines are outlined above.    Disposition:  Follow up  pending results  Signed Ladrea Holladay Verne Spurr, MD, 06/10/2023 2:29 PM    [facility] at [facility] [address] [place], [place], [place] [address]

## 2023-06-12 ENCOUNTER — Other Ambulatory Visit: Payer: Self-pay | Admitting: Internal Medicine

## 2023-06-12 DIAGNOSIS — R0602 Shortness of breath: Secondary | ICD-10-CM

## 2023-06-12 DIAGNOSIS — R0609 Other forms of dyspnea: Secondary | ICD-10-CM

## 2023-06-18 ENCOUNTER — Other Ambulatory Visit (HOSPITAL_COMMUNITY)
Admission: RE | Admit: 2023-06-18 | Discharge: 2023-06-18 | Disposition: A | Discharge status: Home / Self Care | Source: Ambulatory Visit | Attending: Internal Medicine | Admitting: Internal Medicine

## 2023-06-18 DIAGNOSIS — R0602 Shortness of breath: Secondary | ICD-10-CM | POA: Diagnosis not present

## 2023-06-18 DIAGNOSIS — R0609 Other forms of dyspnea: Secondary | ICD-10-CM | POA: Insufficient documentation

## 2023-06-18 LAB — BRAIN NATRIURETIC PEPTIDE: B Natriuretic Peptide: 149 pg/mL — ABNORMAL HIGH (ref 0.0–100.0)

## 2023-06-26 ENCOUNTER — Ambulatory Visit (HOSPITAL_BASED_OUTPATIENT_CLINIC_OR_DEPARTMENT_OTHER)
Admission: RE | Admit: 2023-06-26 | Discharge: 2023-06-26 | Disposition: A | Discharge status: Home / Self Care | Source: Ambulatory Visit | Attending: Internal Medicine | Admitting: Internal Medicine

## 2023-06-26 ENCOUNTER — Ambulatory Visit (HOSPITAL_COMMUNITY)
Admission: RE | Admit: 2023-06-26 | Discharge: 2023-06-26 | Disposition: A | Discharge status: Home / Self Care | Source: Ambulatory Visit | Attending: Family | Admitting: Family

## 2023-06-26 DIAGNOSIS — R079 Chest pain, unspecified: Secondary | ICD-10-CM

## 2023-06-26 LAB — ECHOCARDIOGRAM COMPLETE
AR max vel: 2.33 cm2
AV Area VTI: 2.65 cm2
AV Area mean vel: 2.28 cm2
AV Mean grad: 4.4 mmHg
AV Peak grad: 9.2 mmHg
Ao pk vel: 1.52 m/s
Area-P 1/2: 2.73 cm2
S' Lateral: 2.8 cm

## 2023-06-26 LAB — EXERCISE TOLERANCE TEST
Angina Index: 0
Base ST Depression (mm): 0 mm
Duke Treadmill Score: 7
Estimated workload: 7.8
Exercise duration (min): 6 min
Exercise duration (sec): 32 s
MPHR: 186 {beats}/min
Percent HR: 91 %
RPE: 17
Rest HR: 88 {beats}/min
ST Depression (mm): 0 mm

## 2023-06-26 NOTE — Progress Notes (Signed)
*  PRELIMINARY RESULTS* Echocardiogram 2D Echocardiogram has been performed.  Bernis Brisker 06/26/2023, 10:16 AM

## 2023-07-06 ENCOUNTER — Telehealth: Payer: Self-pay | Admitting: Internal Medicine

## 2023-07-06 NOTE — Telephone Encounter (Signed)
 Vishnu P Mallipeddi, MD 07/06/2023 10:38 AM EDT     Normal ETT.  No ischemia or arrhythmias during stress test.  Schedule follow-up in 3 months due to SOB and elevated BNP.    Patient informed and verbalized understanding of plan.

## 2023-07-06 NOTE — Telephone Encounter (Signed)
 Patients calling about her results. Please advise

## 2023-07-08 ENCOUNTER — Other Ambulatory Visit: Payer: Self-pay | Admitting: Family

## 2023-07-08 DIAGNOSIS — Z30011 Encounter for initial prescription of contraceptive pills: Secondary | ICD-10-CM

## 2023-07-08 NOTE — Telephone Encounter (Signed)
 Pt needs annual PE after 08/07/22 w/ Kathaleen Pale

## 2023-07-08 NOTE — Telephone Encounter (Signed)
 CPE Appt scheduled for 08/14/2023

## 2023-07-12 ENCOUNTER — Other Ambulatory Visit: Payer: Self-pay | Admitting: Family

## 2023-07-12 DIAGNOSIS — R0789 Other chest pain: Secondary | ICD-10-CM

## 2023-08-14 ENCOUNTER — Encounter: Payer: Self-pay | Admitting: Family

## 2023-08-14 ENCOUNTER — Ambulatory Visit (INDEPENDENT_AMBULATORY_CARE_PROVIDER_SITE_OTHER): Admitting: Family

## 2023-08-14 VITALS — BP 115/75 | HR 94 | Temp 97.5°F | Ht 61.0 in | Wt 131.8 lb

## 2023-08-14 DIAGNOSIS — K5909 Other constipation: Secondary | ICD-10-CM

## 2023-08-14 DIAGNOSIS — D509 Iron deficiency anemia, unspecified: Secondary | ICD-10-CM | POA: Diagnosis not present

## 2023-08-14 DIAGNOSIS — Z30011 Encounter for initial prescription of contraceptive pills: Secondary | ICD-10-CM

## 2023-08-14 DIAGNOSIS — Z0000 Encounter for general adult medical examination without abnormal findings: Secondary | ICD-10-CM

## 2023-08-14 DIAGNOSIS — F321 Major depressive disorder, single episode, moderate: Secondary | ICD-10-CM | POA: Diagnosis not present

## 2023-08-14 DIAGNOSIS — E7849 Other hyperlipidemia: Secondary | ICD-10-CM

## 2023-08-14 DIAGNOSIS — Z0001 Encounter for general adult medical examination with abnormal findings: Secondary | ICD-10-CM

## 2023-08-14 DIAGNOSIS — F411 Generalized anxiety disorder: Secondary | ICD-10-CM | POA: Diagnosis not present

## 2023-08-14 MED ORDER — LEVONORGESTREL-ETHINYL ESTRAD 90-20 MCG PO TABS: 1.0000 | ORAL_TABLET | Freq: Every day | ORAL | 3 refills | Status: AC

## 2023-08-14 MED ORDER — ESCITALOPRAM OXALATE 10 MG PO TABS: 10.0000 mg | ORAL_TABLET | Freq: Every day | ORAL | 3 refills | Status: DC

## 2023-08-14 NOTE — Patient Instructions (Signed)

## 2023-08-14 NOTE — Progress Notes (Signed)
 Subjective:    Patient ID: Vanessa Villanueva, female    DOB: [birth year], 33 y.o.   MRN: [id number]  Chief Complaint  Patient presents with   Annual Exam    NO PAP   PT presents to the office today CPE without pap.   She is followed by Hematologists annually for iron anemia.   She is followed by Cardiologists for chest pain.  Constipation This is a chronic problem. The current episode started more than 1 year ago. The problem has been resolved since onset. Her stool frequency is 1 time per day. She has tried diet changes for the symptoms. The treatment provided moderate relief.  Anemia Presents for follow-up visit.  Anxiety Presents for follow-up visit. Symptoms include excessive worry, nervous/anxious behavior and restlessness. Symptoms occur most days. The severity of symptoms is mild.    Depression        This is a chronic problem.  The current episode started more than 1 year ago.   The onset quality is gradual.   The problem occurs intermittently.  Associated symptoms include restlessness.  Associated symptoms include no helplessness, no hopelessness and not sad.  Past treatments include nothing.  Past medical history includes anxiety.       Review of Systems  Gastrointestinal:  Positive for constipation.  Psychiatric/Behavioral:  The patient is nervous/anxious.   All other systems reviewed and are negative.  Family History  Problem Relation Age of Onset   Diabetes Mother    COPD Mother    Mental illness Father        Bipolar, schizophenia   Social History   Socioeconomic History   Marital status: Married    Spouse name: Not on file   Number of children: Not on file   Years of education: Not on file   Highest education level: Not on file  Occupational History   Not on file  Tobacco Use   Smoking status: Never   Smokeless tobacco: Never  Vaping Use   Vaping status: Every Day  Substance and Sexual Activity   Alcohol use: No   Drug use: No   Sexual  activity: Yes    Birth control/protection: Surgical  Other Topics Concern   Not on file  Social History Narrative   Not on file   Social Drivers of Health   Financial Resource Strain: Low Risk  (08/07/2022)   Overall Financial Resource Strain (CARDIA)    Difficulty of Paying Living Expenses: Not hard at all  Food Insecurity: No Food Insecurity (08/07/2022)   Hunger Vital Sign    Worried About Running Out of Food in the Last Year: Never true    Ran Out of Food in the Last Year: Never true  Transportation Needs: Patient Declined (08/07/2022)   PRAPARE - Transportation    Lack of Transportation (Medical): Patient declined    Lack of Transportation (Non-Medical): Patient declined  Physical Activity: Unknown (08/07/2022)   Exercise Vital Sign    Days of Exercise per Week: Patient declined    Minutes of Exercise per Session: Not on file  Stress: No Stress Concern Present (08/07/2022)   [employer] of Occupational Health - Occupational Stress Questionnaire    Feeling of Stress : Not at all  Social Connections: Unknown (08/07/2022)   Social Connection and Isolation Panel    Frequency of Communication with Friends and Family: Patient declined    Frequency of Social Gatherings with Friends and Family: Patient declined    Attends Religious Services: Patient  declined    Active Member of Clubs or Organizations: Patient declined    Attends Banker Meetings: Not on file    Marital Status: Patient declined       Objective:   Physical Exam Vitals reviewed.  Constitutional:      General: She is not in acute distress.    Appearance: She is well-developed.  HENT:     Head: Normocephalic and atraumatic.     Right Ear: Tympanic membrane normal.     Left Ear: Tympanic membrane normal.   Eyes:     Pupils: Pupils are equal, round, and reactive to light.   Neck:     Thyroid : No thyromegaly.   Cardiovascular:     Rate and Rhythm: Normal rate and regular rhythm.     Heart sounds:  Normal heart sounds. No murmur heard. Pulmonary:     Effort: Pulmonary effort is normal. No respiratory distress.     Breath sounds: Normal breath sounds. No wheezing.  Abdominal:     General: Bowel sounds are normal. There is no distension.     Palpations: Abdomen is soft.     Tenderness: There is no abdominal tenderness.   Musculoskeletal:        General: No tenderness. Normal range of motion.     Cervical back: Normal range of motion and neck supple.   Skin:    General: Skin is warm and dry.   Neurological:     Mental Status: She is alert and oriented to person, place, and time.     Cranial Nerves: No cranial nerve deficit.     Deep Tendon Reflexes: Reflexes are normal and symmetric.   Psychiatric:        Behavior: Behavior normal.        Thought Content: Thought content normal.        Judgment: Judgment normal.       BP 115/75   Pulse 94   Temp (!) 97.5 F (36.4 C) (Temporal)   Ht 5' 1 (1.549 m)   Wt 131 lb 12.8 oz (59.8 kg)   BMI 24.90 kg/m      Assessment & Plan:  [name] comes in today with chief complaint of Annual Exam (NO PAP)   Diagnosis and orders addressed:  1. Encounter for oral contraception initial prescription - levonorgestrel -ethinyl estradiol (LYBREL) 90-20 MCG tablet; Take 1 tablet by mouth daily.  Dispense: 84 tablet; Refill: 3 - CMP14+EGFR  2. GAD (generalized anxiety disorder) - CMP14+EGFR - escitalopram  (LEXAPRO ) 10 MG tablet; Take 1 tablet (10 mg total) by mouth daily.  Dispense: 90 tablet; Refill: 3  3. Depression, major, single episode, moderate (HCC) - CMP14+EGFR  4. Chronic constipation  - CMP14+EGFR  5. Iron deficiency anemia, unspecified iron deficiency anemia type - CMP14+EGFR  6. Other hyperlipidemia  - CMP14+EGFR - Lipid panel  7. Annual physical exam (Primary) - CMP14+EGFR - Lipid panel - CBC with Differential/Platelet   Labs pending Start Lexapro  10 mg  Stress management  Health Maintenance  reviewed Diet and exercise encouraged  Follow up plan: 1 months for GAD   Tommas Fragmin, FNP

## 2023-08-15 LAB — CBC WITH DIFFERENTIAL/PLATELET
Basophils Absolute: 0 10*3/uL (ref 0.0–0.2)
Basos: 1 %
EOS (ABSOLUTE): 0.3 10*3/uL (ref 0.0–0.4)
Eos: 4 %
Hematocrit: 38.7 % (ref 34.0–46.6)
Hemoglobin: 12.7 g/dL (ref 11.1–15.9)
Immature Grans (Abs): 0 10*3/uL (ref 0.0–0.1)
Immature Granulocytes: 0 %
Lymphocytes Absolute: 1.4 10*3/uL (ref 0.7–3.1)
Lymphs: 17 %
MCH: 32.9 pg (ref 26.6–33.0)
MCHC: 32.8 g/dL (ref 31.5–35.7)
MCV: 100 fL — ABNORMAL HIGH (ref 79–97)
Monocytes Absolute: 0.4 10*3/uL (ref 0.1–0.9)
Monocytes: 5 %
Neutrophils Absolute: 6.1 10*3/uL (ref 1.4–7.0)
Neutrophils: 73 %
Platelets: 319 10*3/uL (ref 150–450)
RBC: 3.86 x10E6/uL (ref 3.77–5.28)
RDW: 11.4 % — ABNORMAL LOW (ref 11.7–15.4)
WBC: 8.3 10*3/uL (ref 3.4–10.8)

## 2023-08-15 LAB — LIPID PANEL
Chol/HDL Ratio: 3.2 ratio (ref 0.0–4.4)
Cholesterol, Total: 218 mg/dL — ABNORMAL HIGH (ref 100–199)
HDL: 68 mg/dL (ref >39)
LDL Chol Calc (NIH): 125 mg/dL — ABNORMAL HIGH (ref 0–99)
Triglycerides: 142 mg/dL (ref 0–149)
VLDL Cholesterol Cal: 25 mg/dL (ref 5–40)

## 2023-08-15 LAB — CMP14+EGFR
ALT: 16 IU/L (ref 0–32)
AST: 16 IU/L (ref 0–40)
Albumin: 4.2 g/dL (ref 3.9–4.9)
Alkaline Phosphatase: 66 IU/L (ref 44–121)
BUN/Creatinine Ratio: 16 (ref 9–23)
BUN: 11 mg/dL (ref 6–20)
Bilirubin Total: 0.6 mg/dL (ref 0.0–1.2)
CO2: 19 mmol/L — ABNORMAL LOW (ref 20–29)
Calcium: 9.3 mg/dL (ref 8.7–10.2)
Chloride: 105 mmol/L (ref 96–106)
Creatinine, Ser: 0.7 mg/dL (ref 0.57–1.00)
Globulin, Total: 2.7 g/dL (ref 1.5–4.5)
Glucose: 91 mg/dL (ref 70–99)
Potassium: 4.2 mmol/L (ref 3.5–5.2)
Sodium: 139 mmol/L (ref 134–144)
Total Protein: 6.9 g/dL (ref 6.0–8.5)
eGFR: 116 mL/min/{1.73_m2} (ref >59)

## 2023-08-17 ENCOUNTER — Ambulatory Visit: Payer: Self-pay | Admitting: Family

## 2023-09-15 ENCOUNTER — Ambulatory Visit: Admitting: Family

## 2023-09-15 ENCOUNTER — Encounter: Payer: Self-pay | Admitting: Family

## 2023-09-15 VITALS — BP 107/75 | HR 86 | Temp 97.6°F | Ht 61.0 in | Wt 131.2 lb

## 2023-09-15 DIAGNOSIS — F411 Generalized anxiety disorder: Secondary | ICD-10-CM | POA: Diagnosis not present

## 2023-09-15 DIAGNOSIS — F321 Major depressive disorder, single episode, moderate: Secondary | ICD-10-CM | POA: Diagnosis not present

## 2023-09-15 MED ORDER — HYDROXYZINE PAMOATE 25 MG PO CAPS: 25.0000 mg | ORAL_CAPSULE | Freq: Three times a day (TID) | ORAL | 1 refills | Status: DC | PRN

## 2023-09-15 MED ORDER — ESCITALOPRAM OXALATE 20 MG PO TABS: 20.0000 mg | ORAL_TABLET | Freq: Every day | ORAL | 1 refills | Status: AC

## 2023-09-15 NOTE — Addendum Note (Signed)
 Addended by: LAVELL LYE A on: 09/15/2023 11:25 AM   Modules accepted: Level of Service

## 2023-09-15 NOTE — Progress Notes (Signed)
 Subjective:    Patient ID: Vanessa Villanueva, female    DOB: [birth year], 33 y.o.   MRN: [id number]  Chief Complaint  Patient presents with   Follow-up   PT presents to the office today to follow up on GAD. She was seen last month and started on Lexapro  10 mg. Reports she can tell a slightly difference.  Anxiety Presents for follow-up visit. Symptoms include decreased concentration, depressed mood, excessive worry, irritability, nervous/anxious behavior, palpitations and restlessness. Symptoms occur most days. The severity of symptoms is mild.    Depression        This is a chronic problem.  The problem occurs intermittently.  Associated symptoms include decreased concentration and restlessness.  Associated symptoms include no helplessness, no hopelessness and not sad.  Past treatments include SSRIs - Selective serotonin reuptake inhibitors.  Past medical history includes anxiety.       Review of Systems  Constitutional:  Positive for irritability.  Cardiovascular:  Positive for palpitations.  Psychiatric/Behavioral:  Positive for decreased concentration and depression. The patient is nervous/anxious.   All other systems reviewed and are negative.   Social History   Socioeconomic History   Marital status: Married    Spouse name: Not on file   Number of children: Not on file   Years of education: Not on file   Highest education level: Not on file  Occupational History   Not on file  Tobacco Use   Smoking status: Never   Smokeless tobacco: Never  Vaping Use   Vaping status: Every Day  Substance and Sexual Activity   Alcohol use: No   Drug use: No   Sexual activity: Yes    Birth control/protection: Surgical  Other Topics Concern   Not on file  Social History Narrative   Not on file   Social Drivers of Health   Financial Resource Strain: Low Risk  (08/07/2022)   Overall Financial Resource Strain (CARDIA)    Difficulty of Paying Living Expenses: Not hard at all   Food Insecurity: No Food Insecurity (08/07/2022)   Hunger Vital Sign    Worried About Running Out of Food in the Last Year: Never true    Ran Out of Food in the Last Year: Never true  Transportation Needs: Patient Declined (08/07/2022)   PRAPARE - Transportation    Lack of Transportation (Medical): Patient declined    Lack of Transportation (Non-Medical): Patient declined  Physical Activity: Unknown (08/07/2022)   Exercise Vital Sign    Days of Exercise per Week: Patient declined    Minutes of Exercise per Session: Not on file  Stress: No Stress Concern Present (08/07/2022)   [employer] of Occupational Health - Occupational Stress Questionnaire    Feeling of Stress : Not at all  Social Connections: Unknown (08/07/2022)   Social Connection and Isolation Panel    Frequency of Communication with Friends and Family: Patient declined    Frequency of Social Gatherings with Friends and Family: Patient declined    Attends Religious Services: Patient declined    Database administrator or Organizations: Patient declined    Attends Engineer, structural: Not on file    Marital Status: Patient declined   Family History  Problem Relation Age of Onset   Diabetes Mother    COPD Mother    Mental illness Father        Bipolar, schizophenia        Objective:   Physical Exam Vitals reviewed.  Constitutional:  General: She is not in acute distress.    Appearance: She is well-developed.  HENT:     Head: Normocephalic and atraumatic.  Eyes:     Pupils: Pupils are equal, round, and reactive to light.  Neck:     Thyroid : No thyromegaly.  Cardiovascular:     Rate and Rhythm: Normal rate and regular rhythm.     Heart sounds: Normal heart sounds. No murmur heard. Pulmonary:     Effort: Pulmonary effort is normal. No respiratory distress.     Breath sounds: Normal breath sounds. No wheezing.  Abdominal:     General: Bowel sounds are normal. There is no distension.     Palpations:  Abdomen is soft.     Tenderness: There is no abdominal tenderness.  Musculoskeletal:        General: No tenderness. Normal range of motion.     Cervical back: Normal range of motion and neck supple.  Skin:    General: Skin is warm and dry.  Neurological:     Mental Status: She is alert and oriented to person, place, and time.     Cranial Nerves: No cranial nerve deficit.     Deep Tendon Reflexes: Reflexes are normal and symmetric.  Psychiatric:        Behavior: Behavior normal.        Thought Content: Thought content normal.        Judgment: Judgment normal.       BP 107/75   Pulse 86   Temp 97.6 F (36.4 C)   Ht 5' 1 (1.549 m)   Wt 131 lb 3.2 oz (59.5 kg)   BMI 24.79 kg/m      Assessment & Plan:  Vanessa Villanueva comes in today with chief complaint of Follow-up   Diagnosis and orders addressed:  1. GAD (generalized anxiety disorder) (Primary) - escitalopram  (LEXAPRO ) 20 MG tablet; Take 1 tablet (20 mg total) by mouth daily.  Dispense: 90 tablet; Refill: 1 - hydrOXYzine  (VISTARIL ) 25 MG capsule; Take 1 capsule (25 mg total) by mouth every 8 (eight) hours as needed.  Dispense: 90 capsule; Refill: 1  2. Depression, major, single episode, moderate (HCC) - escitalopram  (LEXAPRO ) 20 MG tablet; Take 1 tablet (20 mg total) by mouth daily.  Dispense: 90 tablet; Refill: 1  Will increase Lexapro  to 20 mg from 10 mg Stress management  Vistaril  25 mg TID Prn  Follow up in 1 month to recheck     Bari Learn, FNP

## 2023-09-15 NOTE — Patient Instructions (Signed)

## 2023-10-05 ENCOUNTER — Encounter: Payer: Self-pay | Admitting: Internal Medicine

## 2023-10-05 ENCOUNTER — Ambulatory Visit: Attending: Internal Medicine | Admitting: Internal Medicine

## 2023-10-05 VITALS — BP 102/68 | HR 69 | Ht 61.0 in | Wt 132.2 lb

## 2023-10-05 DIAGNOSIS — E7849 Other hyperlipidemia: Secondary | ICD-10-CM | POA: Insufficient documentation

## 2023-10-05 DIAGNOSIS — R7989 Other specified abnormal findings of blood chemistry: Secondary | ICD-10-CM | POA: Diagnosis not present

## 2023-10-05 DIAGNOSIS — R0609 Other forms of dyspnea: Secondary | ICD-10-CM | POA: Insufficient documentation

## 2023-10-05 DIAGNOSIS — R079 Chest pain, unspecified: Secondary | ICD-10-CM | POA: Diagnosis not present

## 2023-10-05 NOTE — Progress Notes (Signed)
 Cardiology Office Note  Date: 10/05/2023   ID: Vanessa Villanueva 1990/06/26, MRN [id number]  PCP:  Vanessa Bari DELENA, FNP  Cardiologist:  Diannah SHAUNNA Maywood, MD Electrophysiologist:  None   History of Present Illness: Vanessa Villanueva is a 33 y.o. female with no PMH is here for follow-up visit of chest pains and SOB.    Patient was initially referred to cardiology clinic for evaluation of chest pains and SOB.  She was having ongoing symptoms of chest pain, DOE, dizziness, palpitations with exertion x one year.  This improved after she switched to high-protein diet.  Sometimes when she gets dizzy due to low blood pressures, she had to eat a lot of salt and drink water to increase her blood pressure.  She underwent ETT, echocardiogram and event monitor which are unremarkable.  I reviewed the ETT and echocardiogram reports with the patient today.  I reviewed the echocardiogram images with the patient today.  She reported having chest pains at rest and not with exertion, shortness of breath mainly with exertion and especially humidity makes it worse. BNP/pro-BNP is mildly elevated.  Orthostatics performed previously in the clinic were negative for orthostatic hypotension and POTS.    Past Medical History:  Diagnosis Date   Anemia    Vitamin B12 deficiency 04/20/2021    Past Surgical History:  Procedure Laterality Date   TUBAL LIGATION      Current Outpatient Medications  Medication Sig Dispense Refill   EPINEPHrine  0.3 mg/0.3 mL IJ SOAJ injection Inject 0.3 mg into the muscle as needed for anaphylaxis. 1 each 1   escitalopram  (LEXAPRO ) 20 MG tablet Take 1 tablet (20 mg total) by mouth daily. 90 tablet 1   famotidine (PEPCID) 20 MG tablet Take 20 mg by mouth 2 (two) times daily.     ferrous sulfate  325 (65 FE) MG tablet Take 1 tablet (325 mg total) by mouth daily with breakfast. 90 tablet 3   hydrOXYzine  (VISTARIL ) 25 MG capsule Take 1 capsule (25 mg total) by mouth every 8  (eight) hours as needed. 90 capsule 1   ibuprofen  (ADVIL ) 800 MG tablet TAKE 1 TABLET BY MOUTH EVERY 8 HOURS AS NEEDED 90 tablet 2   levonorgestrel -ethinyl estradiol (LYBREL) 90-20 MCG tablet Take 1 tablet by mouth daily. 84 tablet 3   rosuvastatin  (CRESTOR ) 5 MG tablet Take 1 tablet (5 mg total) by mouth daily. 30 tablet 5   vitamin B-12 (CYANOCOBALAMIN ) 500 MCG tablet Take 500 mcg by mouth daily.     No current facility-administered medications for this visit.   Allergies:  Codeine   Social History: The patient  reports that she has never smoked. She has never used smokeless tobacco. She reports that she does not drink alcohol and does not use drugs.   Family History: The patient's family history includes COPD in her mother; Diabetes in her mother; Mental illness in her father.   ROS:  Please see the history of present illness. Otherwise, complete review of systems is positive for none  All other systems are reviewed and negative.   Physical Exam: VS:  There were no vitals taken for this visit., BMI There is no height or weight on file to calculate BMI.  Wt Readings from Last 3 Encounters:  09/15/23 131 lb 3.2 oz (59.5 kg)  08/14/23 131 lb 12.8 oz (59.8 kg)  06/10/23 131 lb 6.4 oz (59.6 kg)    General: Patient appears comfortable at rest. HEENT: Conjunctiva and lids normal, oropharynx  clear with moist mucosa. Neck: Supple, no elevated JVP or carotid bruits, no thyromegaly. Lungs: Clear to auscultation, nonlabored breathing at rest. Cardiac: Regular rate and rhythm, no S3 or significant systolic murmur, no pericardial rub. Abdomen: Soft, nontender, no hepatomegaly, bowel sounds present, no guarding or rebound. Extremities: No pitting edema, distal pulses 2+. Skin: Warm and dry. Musculoskeletal: No kyphosis. Neuropsychiatric: Alert and oriented x3, affect grossly appropriate.  Recent Labwork: 04/13/2023: TSH 1.410 06/18/2023: B Natriuretic Peptide 149.0 08/14/2023: ALT 16; AST 16;  BUN 11; Creatinine, Ser 0.70; Hemoglobin 12.7; Platelets 319; Potassium 4.2; Sodium 139     Component Value Date/Time   CHOL 218 (H) 08/14/2023 1218   TRIG 142 08/14/2023 1218   HDL 68 08/14/2023 1218   CHOLHDL 3.2 08/14/2023 1218   LDLCALC 125 (H) 08/14/2023 1218     Assessment and Plan:  Patient is a 33 year old F with no PMH was referred to cardiology clinic for evaluation of chest pain. Ongoing symptoms of chest pain, SOB, dizziness with exertion for many months/a year that has improved with high-protein diet. Event monitor is unremarkable, symptoms correlated with NSR, PAC and PVC. I reviewed echocardiogram and ETT with the patient today. I reviewed echocardiogram images and noted trivial PI and not mild PI which is again benign and provided reassurance. She does have elevated BNP and proBNP along with symptoms of DOE. I am hesitant to start p.o. Lasix as her echocardiogram is unremarkable and diastolic dysfunction is low on the differential due to young age. In addition, whenever she feels dizzy from hypotension, she eats a lot of salt and drinks water to raise her blood pressure up after which her dizziness completely resolves. Orthostatic vitals previously performed in the clinic were negative for orthostatic hypotension and POTS. I reviewed her lipid panel that showed LDL 125 (improved from 142), currently on rosuvastatin  5 mg nightly. Can follow with PCP for HLD management.  Follows high-protein diet now. Heart healthy diet and exercise recommended.      Medication Adjustments/Labs and Tests Ordered: Current medicines are reviewed at length with the patient today.  Concerns regarding medicines are outlined above.    Disposition:  Follow up 8 months  Signed Cait Locust Priya Cena Bruhn, MD, 10/05/2023 11:17 AM    [facility] at [facility] [address] [place], [place], [place] [address]

## 2023-10-05 NOTE — Patient Instructions (Signed)
 Medication Instructions:  Your physician recommends that you continue on your current medications as directed. Please refer to the Current Medication list given to you today.   Labwork: None  Testing/Procedures: None  Follow-Up: Your physician recommends that you schedule a follow-up appointment in: 8 months  Any Other Special Instructions Will Be Listed Below (If Applicable).  Thank you for choosing [facility]!      If you need a refill on your cardiac medications before your next appointment, please call your pharmacy.

## 2023-10-10 ENCOUNTER — Other Ambulatory Visit: Payer: Self-pay | Admitting: Family

## 2023-10-10 DIAGNOSIS — F411 Generalized anxiety disorder: Secondary | ICD-10-CM

## 2023-10-30 ENCOUNTER — Ambulatory Visit: Admitting: Family

## 2023-11-05 ENCOUNTER — Encounter: Payer: Self-pay | Admitting: Family

## 2023-11-21 DIAGNOSIS — R1031 Right lower quadrant pain: Secondary | ICD-10-CM | POA: Diagnosis not present

## 2023-11-21 DIAGNOSIS — R109 Unspecified abdominal pain: Secondary | ICD-10-CM | POA: Diagnosis not present

## 2023-11-21 DIAGNOSIS — N3000 Acute cystitis without hematuria: Secondary | ICD-10-CM | POA: Diagnosis not present

## 2023-11-23 ENCOUNTER — Other Ambulatory Visit: Payer: Self-pay | Admitting: Family

## 2023-11-23 ENCOUNTER — Ambulatory Visit: Payer: Self-pay

## 2023-11-23 MED ORDER — CEPHALEXIN 500 MG PO CAPS: 500.0000 mg | ORAL_CAPSULE | Freq: Two times a day (BID) | ORAL | 0 refills | Status: DC

## 2023-11-23 NOTE — Telephone Encounter (Signed)
 Called and left message.

## 2023-11-23 NOTE — Telephone Encounter (Signed)
 FYI Only or Action Required?: Action required by provider: update on patient condition.  Patient was last seen in primary care on 09/15/2023 by Lavell Bari LABOR, FNP.  Called Nurse Triage reporting Allergic Reaction.  Symptoms began today.  Interventions attempted: Prescription medications: macrobid.  Symptoms are: unchanged.  Triage Disposition: See HCP Within 4 Hours (Or PCP Triage)  Patient/caregiver understands and will follow disposition?: UnsureCopied from CRM #[id number]. Topic: Clinical - Red Word Triage >> Nov 23, 2023 11:32 AM Carlatta H wrote: [facility] that prompted transfer to Nurse Triage: Patient is having a reaction to new medication prescribed at the hospital 9/20//Macrobid//Patient has blotchy skin and swollen lips Reason for Disposition  [1] Taking new prescription medication AND [2] rash within 4 hours of 1st dose  Answer Assessment - Initial Assessment Questions Pt denies breathing and swallowing trouble. Lips are noticeably swollen.RN advised to take benadryl now and to hold Macrobid dose. Last dose taken last night. Pt can't go to surrounding offices for appt. Pt wants office to call in another abx. Please advise     1. APPEARANCE of RASH: What does the rash look like? (e.g., spots, blisters, raised areas, skin peeling, scaly)     Red and blotchy  2. SIZE: How big are the spots? (e.g., tip of pen, eraser, coin; inches, centimeters)     Random sizes 3. LOCATION: Where is the rash located?     face 4. COLOR: What color is the rash? (Note: It is difficult to assess rash color in people with darker-colored skin. When this situation occurs, simply ask the caller to describe what they see.)     red 5. ONSET: When did the rash begin?     today 6. FEVER: Do you have a fever? If Yes, ask: What is your temperature, how was it measured, and when did it start?     denies 7. ITCHING: Does the rash itch? If Yes, ask: How bad is the itch? (Scale 1-10; or  mild, moderate, severe)     Lips itch  8. CAUSE: What do you think is causing the rash?     Macrobid  9. NEW MEDICINES: What new medicines are you taking? (e.g., name of antibiotic) When did you start taking this medication?.     Macrobid  10. OTHER SYMPTOMS: Do you have any other symptoms? (e.g., sore throat, fever, joint pain)       Swollen lips  Protocols used: Rash - Widespread On Drugs-A-AH

## 2023-11-23 NOTE — Telephone Encounter (Signed)
 Stop macrobid. Please add to allergy list. Keflex  Prescription sent to pharmacy

## 2023-12-08 ENCOUNTER — Ambulatory Visit: Admitting: Nurse Practitioner

## 2023-12-08 ENCOUNTER — Encounter: Payer: Self-pay | Admitting: Nurse Practitioner

## 2023-12-08 VITALS — BP 107/67 | HR 81 | Temp 97.6°F | Ht 61.0 in | Wt 139.0 lb

## 2023-12-08 DIAGNOSIS — N3001 Acute cystitis with hematuria: Secondary | ICD-10-CM | POA: Insufficient documentation

## 2023-12-08 DIAGNOSIS — N2889 Other specified disorders of kidney and ureter: Secondary | ICD-10-CM | POA: Diagnosis not present

## 2023-12-08 DIAGNOSIS — M545 Low back pain, unspecified: Secondary | ICD-10-CM | POA: Diagnosis not present

## 2023-12-08 LAB — URINALYSIS, ROUTINE W REFLEX MICROSCOPIC
Bilirubin, UA: NEGATIVE
Glucose, UA: NEGATIVE
Ketones, UA: NEGATIVE
Nitrite, UA: NEGATIVE
Specific Gravity, UA: 1.02 (ref 1.005–1.030)
Urobilinogen, Ur: 0.2 mg/dL (ref 0.2–1.0)
pH, UA: 6 (ref 5.0–7.5)

## 2023-12-08 LAB — MICROSCOPIC EXAMINATION: Renal Epithel, UA: NONE SEEN /HPF

## 2023-12-08 MED ORDER — FLUCONAZOLE 150 MG PO TABS: 150.0000 mg | ORAL_TABLET | Freq: Once | ORAL | 1 refills | Status: AC

## 2023-12-08 MED ORDER — AMOXICILLIN-POT CLAVULANATE 875-125 MG PO TABS
1.0000 | ORAL_TABLET | Freq: Two times a day (BID) | ORAL | 0 refills | Status: AC
Start: 2023-12-08 — End: ?

## 2023-12-08 NOTE — Progress Notes (Signed)
 Subjective:  Patient ID: Vanessa Villanueva, female    DOB: 02/15/1991, 33 y.o.   MRN: [id number]  Patient Care Team: Lavell Bari DELENA, FNP as PCP - General (Family Medicine) Stacia Diannah SQUIBB, MD as PCP - Cardiology (Cardiology)   Chief Complaint:  Pyelonephritis (Dx with kidney infection few weeks ago finished the full course of antibiotic but now having pain again)   HPI: Vanessa Villanueva is a 33 y.o. female presenting on 12/08/2023 for Pyelonephritis (Dx with kidney infection few weeks ago finished the full course of antibiotic but now having pain again)   Discussed the use of AI scribe software for clinical note transcription with the patient, who gave verbal consent to proceed.  History of Present Illness Vanessa Villanueva is a 33 year old female with a history of kidney stones who presents with recurrent kidney pain.  Approximately three weeks ago, she experienced severe pain in the kidney area, prompting a visit to urgent care and subsequently the hospital. She was diagnosed with a kidney infection and initially prescribed Ciprofloxacin, which caused an allergic reaction. She was then switched to Macrobid, which she completed without issues.  After completing the antibiotic course, she developed bronchitis. Following a period of feeling well, her kidney pain returned yesterday.  She has a history of a benign kidney tumor, which occasionally causes pain, and kidney stones due to poor calcium  absorption, leading her to avoid calcium  intake.  No fever or dysuria. She reports frequent urination with a sensation of incomplete bladder emptying, and experiences some lower abdominal pressure when she needs to urinate. Abdominal ultrasound on 07/10/2021 revealed a 2 mm round echogenic focus in the right kidney parenchyma. The patient previously followed with urology for monitoring of the renal lesion but discontinued follow-up a few years ago.    Relevant past medical,  surgical, family, and social history reviewed and updated as indicated.  Allergies and medications reviewed and updated. Data reviewed: Chart in Epic.   Past Medical History:  Diagnosis Date   Anemia    Vitamin B12 deficiency 04/20/2021    Past Surgical History:  Procedure Laterality Date   TUBAL LIGATION      Social History   Socioeconomic History   Marital status: Married    Spouse name: Not on file   Number of children: Not on file   Years of education: Not on file   Highest education level: Not on file  Occupational History   Not on file  Tobacco Use   Smoking status: Never   Smokeless tobacco: Never  Vaping Use   Vaping status: Every Day  Substance and Sexual Activity   Alcohol use: No   Drug use: No   Sexual activity: Yes    Birth control/protection: Surgical  Other Topics Concern   Not on file  Social History Narrative   Not on file   Social Drivers of Health   Financial Resource Strain: Low Risk  (08/07/2022)   Overall Financial Resource Strain (CARDIA)    Difficulty of Paying Living Expenses: Not hard at all  Food Insecurity: No Food Insecurity (08/07/2022)   Hunger Vital Sign    Worried About Running Out of Food in the Last Year: Never true    Ran Out of Food in the Last Year: Never true  Transportation Needs: Patient Declined (08/07/2022)   PRAPARE - Administrator, Civil Service (Medical): Patient declined    Lack of Transportation (Non-Medical): Patient declined  Physical Activity:  Unknown (08/07/2022)   Exercise Vital Sign    Days of Exercise per Week: Patient declined    Minutes of Exercise per Session: Not on file  Stress: No Stress Concern Present (08/07/2022)   [employer] of Occupational Health - Occupational Stress Questionnaire    Feeling of Stress : Not at all  Social Connections: Unknown (08/07/2022)   Social Connection and Isolation Panel    Frequency of Communication with Friends and Family: Patient declined    Frequency of  Social Gatherings with Friends and Family: Patient declined    Attends Religious Services: Patient declined    Active Member of Clubs or Organizations: Patient declined    Attends Banker Meetings: Not on file    Marital Status: Patient declined  Intimate Partner Violence: Not on file    Outpatient Encounter Medications as of 12/08/2023  Medication Sig   amoxicillin -clavulanate (AUGMENTIN ) 875-125 MG tablet Take 1 tablet by mouth 2 (two) times daily.   EPINEPHrine  0.3 mg/0.3 mL IJ SOAJ injection Inject 0.3 mg into the muscle as needed for anaphylaxis.   escitalopram  (LEXAPRO ) 20 MG tablet Take 1 tablet (20 mg total) by mouth daily.   famotidine (PEPCID) 20 MG tablet Take 20 mg by mouth 2 (two) times daily.   ferrous sulfate  325 (65 FE) MG tablet Take 1 tablet (325 mg total) by mouth daily with breakfast.   fluconazole (DIFLUCAN) 150 MG tablet Take 1 tablet (150 mg total) by mouth once for 1 dose.   hydrOXYzine  (VISTARIL ) 25 MG capsule TAKE 1 CAPSULE (25 MG TOTAL) BY MOUTH EVERY 8 (EIGHT) HOURS AS NEEDED.   ibuprofen  (ADVIL ) 800 MG tablet TAKE 1 TABLET BY MOUTH EVERY 8 HOURS AS NEEDED   levonorgestrel -ethinyl estradiol (LYBREL) 90-20 MCG tablet Take 1 tablet by mouth daily.   rosuvastatin  (CRESTOR ) 5 MG tablet Take 1 tablet (5 mg total) by mouth daily.   vitamin B-12 (CYANOCOBALAMIN ) 500 MCG tablet Take 500 mcg by mouth daily.   [DISCONTINUED] cephALEXin  (KEFLEX ) 500 MG capsule Take 1 capsule (500 mg total) by mouth 2 (two) times daily. (Patient not taking: Reported on 12/08/2023)   No facility-administered encounter medications on file as of 12/08/2023.    Allergies  Allergen Reactions   Codeine Nausea And Vomiting   Macrobid [Nitrofurantoin]     Pertinent ROS per HPI, otherwise unremarkable      Objective:  BP 107/67   Pulse 81   Temp 97.6 F (36.4 C) (Temporal)   Ht 5' 1 (1.549 m)   Wt 139 lb (63 kg)   SpO2 99%   BMI 26.26 kg/m    Wt Readings from Last 3  Encounters:  12/08/23 139 lb (63 kg)  10/05/23 132 lb 3.2 oz (60 kg)  09/15/23 131 lb 3.2 oz (59.5 kg)    Physical Exam Vitals and nursing note reviewed.  Constitutional:      General: She is not in acute distress.    Appearance: Normal appearance.  HENT:     Head: Normocephalic and atraumatic.     Nose: Nose normal.     Mouth/Throat:     Mouth: Mucous membranes are moist.  Eyes:     General: No scleral icterus.    Extraocular Movements: Extraocular movements intact.     Conjunctiva/sclera: Conjunctivae normal.     Pupils: Pupils are equal, round, and reactive to light.  Cardiovascular:     Heart sounds: Normal heart sounds.  Pulmonary:     Effort: Pulmonary effort is normal.  Breath sounds: Normal breath sounds.  Abdominal:     General: Bowel sounds are normal.     Palpations: Abdomen is soft.     Tenderness: There is no right CVA tenderness or left CVA tenderness.  Musculoskeletal:        General: Normal range of motion.     Right lower leg: No edema.     Left lower leg: No edema.  Skin:    General: Skin is warm and dry.     Findings: No rash.  Neurological:     Mental Status: She is alert and oriented to person, place, and time.  Psychiatric:        Mood and Affect: Mood normal.        Behavior: Behavior normal.        Thought Content: Thought content normal.        Judgment: Judgment normal.    Physical Exam ABDOMEN: No costovertebral angle tenderness.     Results for orders placed or performed in visit on 08/14/23  CMP14+EGFR   Collection Time: 08/14/23 12:18 PM  Result Value Ref Range   Glucose 91 70 - 99 mg/dL   BUN 11 6 - 20 mg/dL   Creatinine, Ser 9.29 0.57 - 1.00 mg/dL   eGFR 883 >40 fO/fpw/8.26   BUN/Creatinine Ratio 16 9 - 23   Sodium 139 134 - 144 mmol/L   Potassium 4.2 3.5 - 5.2 mmol/L   Chloride 105 96 - 106 mmol/L   CO2 19 (L) 20 - 29 mmol/L   Calcium  9.3 8.7 - 10.2 mg/dL   Total Protein 6.9 6.0 - 8.5 g/dL   Albumin 4.2 3.9 - 4.9  g/dL   Globulin, Total 2.7 1.5 - 4.5 g/dL   Bilirubin Total 0.6 0.0 - 1.2 mg/dL   Alkaline Phosphatase 66 44 - 121 IU/L   AST 16 0 - 40 IU/L   ALT 16 0 - 32 IU/L  Lipid panel   Collection Time: 08/14/23 12:18 PM  Result Value Ref Range   Cholesterol, Total 218 (H) 100 - 199 mg/dL   Triglycerides 857 0 - 149 mg/dL   HDL 68 >60 mg/dL   VLDL Cholesterol Cal 25 5 - 40 mg/dL   LDL Chol Calc (NIH) 874 (H) 0 - 99 mg/dL   Chol/HDL Ratio 3.2 0.0 - 4.4 ratio  CBC with Differential/Platelet   Collection Time: 08/14/23 12:18 PM  Result Value Ref Range   WBC 8.3 3.4 - 10.8 x10E3/uL   RBC 3.86 3.77 - 5.28 x10E6/uL   Hemoglobin 12.7 11.1 - 15.9 g/dL   Hematocrit 61.2 65.9 - 46.6 %   MCV 100 (H) 79 - 97 fL   MCH 32.9 26.6 - 33.0 pg   MCHC 32.8 31.5 - 35.7 g/dL   RDW 88.5 (L) 88.2 - 84.5 %   Platelets 319 150 - 450 x10E3/uL   Neutrophils 73 Not Estab. %   Lymphs 17 Not Estab. %   Monocytes 5 Not Estab. %   Eos 4 Not Estab. %   Basos 1 Not Estab. %   Neutrophils Absolute 6.1 1.4 - 7.0 x10E3/uL   Lymphocytes Absolute 1.4 0.7 - 3.1 x10E3/uL   Monocytes Absolute 0.4 0.1 - 0.9 x10E3/uL   EOS (ABSOLUTE) 0.3 0.0 - 0.4 x10E3/uL   Basophils Absolute 0.0 0.0 - 0.2 x10E3/uL   Immature Granulocytes 0 Not Estab. %   Immature Grans (Abs) 0.0 0.0 - 0.1 x10E3/uL   Urine dipstick shows positive for 1+ WBC's, positive  for protein trace, and positive for leukocytes 2+.   Micro exam: 6-10 WBC's per HPF, 0-2 RBC's per HPF, moderate + bacteria, yeast present and epithelial cells 0-10.      Pertinent labs & imaging results that were available during my care of the patient were reviewed by me and considered in my medical decision making.  Assessment & Plan:  Amiaya was seen today for pyelonephritis.  Diagnoses and all orders for this visit:  Acute low back pain, unspecified back pain laterality, unspecified whether sciatica present -     Urinalysis, Routine w reflex microscopic  Acute cystitis with  hematuria -     amoxicillin -clavulanate (AUGMENTIN ) 875-125 MG tablet; Take 1 tablet by mouth 2 (two) times daily. -     fluconazole (DIFLUCAN) 150 MG tablet; Take 1 tablet (150 mg total) by mouth once for 1 dose. -     Ambulatory referral to Urology -     Urine Culture  Right kidney mass -     Ambulatory referral to Urology     Assessment and Plan Vanessa Villanueva is a 33 year old Caucasian female seen today for UTI, no acute distress Assessment & Plan Recurrent kidney pain Increased urinary frequency and urgency without significant output. Will treat with broad-spectrum antibiotic amoxicillin  for 7 days while waiting for culture result to come back.  She understand based on the culture result with no antibiotic may be needed  Pyelonephritis Previous infection treated with Macrobid after allergic reaction to Ciprofloxacin. Current symptoms may suggest recurrence.  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 Refer to Urology  Kidney stones History of kidney stones due to calcium  absorption issues. Current symptoms do not suggest stones as the cause.      Continue all other maintenance medications.  Follow up plan: Return if symptoms worsen or fail to improve.   Continue healthy lifestyle choices, including diet (rich in fruits, vegetables, and lean proteins, and low in salt and simple carbohydrates) and exercise (at least 30 minutes of moderate physical activity daily).  Educational handout given for    Clinical References  Urinary Tract Infection, Female A urinary tract infection (UTI) is an infection in your urinary tract. The urinary tract is made up of organs that make, store, and get rid of pee (urine) in your body. These organs include: The kidneys. The ureters. The bladder. The urethra. What are the causes? Most UTIs are caused by germs called bacteria. They may be in or near your genitals. These germs grow and cause swelling in  your urinary tract. What increases the risk? You're more likely to get a UTI if: You're a female. The urethra is shorter in females than in males. You have a soft tube called a catheter that drains your pee. You can't control when you pee or poop. You have trouble peeing because of: A kidney stone. A urinary blockage. A nerve condition that affects your bladder. Not getting enough to drink. You're sexually active. You use a birth control inside your vagina, like spermicide. You're pregnant. You have low levels of the hormone estrogen in your body. You're an older adult. You're also more likely to get a UTI if you have other health problems. These may include: Diabetes. A weak immune system. Your immune system is your body's defense system. Sickle cell disease. Injury of the spine. What are the signs or symptoms? Symptoms may include: Needing to pee right away. Peeing small amounts often. Pain or burning when you pee. Blood in  your pee. Pee that smells bad or odd. Pain in your belly or lower back. You may also: Feel confused. This may be the first symptom in older adults. Vomit. Not feel hungry. Feel tired or easily annoyed. Have a fever or chills. How is this diagnosed? A UTI is diagnosed based on your medical history and an exam. You may also have other tests. These may include: Pee tests. Blood tests. Tests for sexually transmitted infections (STIs). If you've had more than one UTI, you may need to have imaging studies done to find out why you keep getting them. How is this treated? A UTI can be treated by: Taking antibiotics or other medicines. Drinking enough fluid to keep your pee pale yellow. In rare cases, a UTI can cause a very bad condition called sepsis. Sepsis may be treated in the hospital. Follow these instructions at home: Medicines Take your medicines only as told by your health care provider. If you were given antibiotics, take them as told by your  provider. Do not stop taking them even if you start to feel better. General instructions Make sure you: Pee often and fully. Do not hold your pee for a long time. Wipe from front to back after you pee or poop. Use each tissue only once when you wipe. Pee after you have sex. Do not douche or use sprays or powders in your genital area. Contact a health care provider if: Your symptoms don't get better after 1-2 days of taking antibiotics. Your symptoms go away and then come back. You have a fever or chills. You vomit or feel like you may vomit. Get help right away if: You have very bad pain in your back or lower belly. You faint. This information is not intended to replace advice given to you by your health care provider. Make sure you discuss any questions you have with your health care provider. Document Revised: 01/28/2023 Document Reviewed: 05/23/2022 Elsevier Patient Education  2025 [employer] Acute Back Pain, Adult Acute back pain is sudden and usually short-lived. It is often caused by an injury to the muscles and tissues in the back. The injury may result from: A muscle, tendon, or ligament getting overstretched or torn. Ligaments are tissues that connect bones to each other. Lifting something improperly can cause a back strain. Wear and tear (degeneration) of the spinal disks. Spinal disks are circular tissue that provide cushioning between the bones of the spine (vertebrae). Twisting motions, such as while playing sports or doing yard work. A hit to the back. Arthritis. You may have a physical exam, lab tests, and imaging tests to find the cause of your pain. Acute back pain usually goes away with rest and home care. Follow these instructions at home: Managing pain, stiffness, and swelling Take over-the-counter and prescription medicines only as told by your health care provider. Treatment may include medicines for pain and inflammation that are taken by mouth or applied to the  skin, or muscle relaxants. Your health care provider may recommend applying ice during the first 24-48 hours after your pain starts. To do this: Put ice in a plastic bag. Place a towel between your skin and the bag. Leave the ice on for 20 minutes, 2-3 times a day. Remove the ice if your skin turns bright red. This is very important. If you cannot feel pain, heat, or cold, you have a greater risk of damage to the area. If directed, apply heat to the affected area as often as told  by your health care provider. Use the heat source that your health care provider recommends, such as a moist heat pack or a heating pad. Place a towel between your skin and the heat source. Leave the heat on for 20-30 minutes. Remove the heat if your skin turns bright red. This is especially important if you are unable to feel pain, heat, or cold. You have a greater risk of getting burned. Activity  Do not stay in bed. Staying in bed for more than 1-2 days can delay your recovery. Sit up and stand up straight. Avoid leaning forward when you sit or hunching over when you stand. If you work at a desk, sit close to it so you do not need to lean over. Keep your chin tucked in. Keep your neck drawn back, and keep your elbows bent at a 90-degree angle (right angle). Sit high and close to the steering wheel when you drive. Add lower back (lumbar) support to your car seat, if needed. Take short walks on even surfaces as soon as you are able. Try to increase the length of time you walk each day. Do not sit, drive, or stand in one place for more than 30 minutes at a time. Sitting or standing for long periods of time can put stress on your back. Do not drive or use heavy machinery while taking prescription pain medicine. Use proper lifting techniques. When you bend and lift, use positions that put less stress on your back: Irmo your knees. Keep the load close to your body. Avoid twisting. Exercise regularly as told by your  health care provider. Exercising helps your back heal faster and helps prevent back injuries by keeping muscles strong and flexible. Work with a physical therapist to make a safe exercise program, as recommended by your health care provider. Do any exercises as told by your physical therapist. Lifestyle Maintain a healthy weight. Extra weight puts stress on your back and makes it difficult to have good posture. Avoid activities or situations that make you feel anxious or stressed. Stress and anxiety increase muscle tension and can make back pain worse. Learn ways to manage anxiety and stress, such as through exercise. General instructions Sleep on a firm mattress in a comfortable position. Try lying on your side with your knees slightly bent. If you lie on your back, put a pillow under your knees. Keep your head and neck in a straight line with your spine (neutral position) when using electronic equipment like smartphones or pads. To do this: Raise your smartphone or pad to look at it instead of bending your head or neck to look down. Put the smartphone or pad at the level of your face while looking at the screen. Follow your treatment plan as told by your health care provider. This may include: Cognitive or behavioral therapy. Acupuncture or massage therapy. Meditation or yoga. Contact a health care provider if: You have pain that is not relieved with rest or medicine. You have increasing pain going down into your legs or buttocks. Your pain does not improve after 2 weeks. You have pain at night. You lose weight without trying. You have a fever or chills. You develop nausea or vomiting. You develop abdominal pain. Get help right away if: You develop new bowel or bladder control problems. You have unusual weakness or numbness in your arms or legs. You feel faint. These symptoms may represent a serious problem that is an emergency. Do not wait to see if the symptoms  will go away. Get  medical help right away. Call your local emergency services (911 in the [place] Do not drive yourself to the hospital. Summary Acute back pain is sudden and usually short-lived. Use proper lifting techniques. When you bend and lift, use positions that put less stress on your back. Take over-the-counter and prescription medicines only as told by your health care provider, and apply heat or ice as told. This information is not intended to replace advice given to you by your health care provider. Make sure you discuss any questions you have with your health care provider. Document Revised: 05/11/2020 Document Reviewed: 05/11/2020 Elsevier Patient Education  2024 [employer] Inc.  The above assessment and management plan was discussed with the patient. The patient verbalized understanding of and has agreed to the management plan. Patient is aware to call the clinic if they develop any new symptoms or if symptoms persist or worsen. Patient is aware when to return to the clinic for a follow-up visit. Patient educated on when it is appropriate to go to the emergency department.   Elva Breaker [place], DNP [facility] [address] [place], [place] [address] [phone]

## 2023-12-09 ENCOUNTER — Ambulatory Visit: Payer: Self-pay | Admitting: Nurse Practitioner

## 2023-12-10 ENCOUNTER — Ambulatory Visit: Payer: Self-pay

## 2023-12-10 LAB — URINE CULTURE

## 2023-12-10 NOTE — Telephone Encounter (Signed)
 FYI Only or Action Required?: Action required by provider: update on patient condition.  Patient was last seen in primary care on 12/08/2023 by Deitra Morton Sebastian Nena, NP.  Called Nurse Triage reporting Dysuria.  Symptoms began several weeks ago.  Interventions attempted: Prescription medications: Augmentin .  Symptoms are: gradually worsening.  Triage Disposition: See HCP Within 4 Hours (Or PCP Triage)  Patient/caregiver understands and will follow disposition?: No, wishes to speak with PCP      Copied from CRM [id number]. Topic: Clinical - Red Word Triage >> Dec 10, 2023  9:57 AM Deaijah H wrote: Red Word that prompted transfer to Nurse Triage: Antibiotic not helping ; infection getting worse       Reason for Disposition  [1] Taking antibiotic > 24 hours for UTI AND [2] flank or lower back pain getting WORSE  Answer Assessment - Initial Assessment Questions Patient reports the antibiotic is not improving her symptoms and would like to know if it should be changed. Please advise.      1. MAIN SYMPTOM: What is the main symptom you are concerned about? (e.g., painful urination, urine frequency)     Lower abdominal pain 2. BETTER-SAME-WORSE: Are you getting better, staying the same, or getting worse compared to how you felt at your last visit to the doctor (most recent medical visit)?     Worse  3. PAIN: How bad is the pain?  (e.g., Scale 1-10; mild, moderate, or severe)     Moderate to severe  4. FEVER: Do you have a fever? If Yes, ask: What is it, how was it measured, and when did it start?     Fever yesterday but not today  5. OTHER SYMPTOMS: Do you have any other symptoms? (e.g., blood in the urine, flank pain, vaginal discharge)     Blood when wiping after urinating  6. DIAGNOSIS: When was the UTI diagnosed? By whom? Was it a kidney infection, bladder infection or both?     12/08/23 7. ANTIBIOTIC: What antibiotic(s) are you taking? How many times  per day?     Augmentin   8. ANTIBIOTIC - START DATE: When did you start taking the antibiotic?     12/08/23  Protocols used: Urinary Tract Infection on Antibiotic Follow-up Call - [facility]

## 2023-12-10 NOTE — Telephone Encounter (Signed)
 Saw Vanessa Villanueva on 12/08/23 for this. Patient requesting alternate antibiotic. Reports the medicine she is currently taking isnt helping and symptoms are worse. Please advise.

## 2023-12-16 ENCOUNTER — Encounter (INDEPENDENT_AMBULATORY_CARE_PROVIDER_SITE_OTHER): Payer: Self-pay | Admitting: Gastroenterology

## 2024-03-11 ENCOUNTER — Telehealth: Payer: Self-pay

## 2024-03-11 DIAGNOSIS — Z7189 Other specified counseling: Secondary | ICD-10-CM

## 2024-03-11 NOTE — Progress Notes (Signed)
 Complex Care Management Note  Care Guide Note 03/11/2024 Name: [name] MRN: [id number] DOB: 08-23-1990  Vanessa Villanueva is a 34 y.o. year old female who sees [place], Bari DELENA, FNP for primary care. I reached out to [employer] by phone today to offer complex care management services.  Ms. Gillentine was given information about Complex Care Management services today including:   The Complex Care Management services include support from the care team which includes your Nurse Care Manager, Clinical Social Worker, or Pharmacist.  The Complex Care Management team is here to help remove barriers to the health concerns and goals most important to you. Complex Care Management services are voluntary, and the patient may decline or stop services at any time by request to their care team member.   Complex Care Management Consent Status: Patient agreed to services and verbal consent obtained.   Follow up plan:  Telephone appointment with complex care management team member scheduled for:  03/22/2024  Encounter Outcome:  Patient Scheduled  Jeoffrey Buffalo , RMA     [place]  [facility], [facility] Guide  Direct Dial: [phone]  Website: [email or web address]

## 2024-03-22 ENCOUNTER — Encounter: Payer: Self-pay | Admitting: *Deleted

## 2024-03-22 ENCOUNTER — Inpatient Hospital Stay: Attending: Oncology

## 2024-03-22 ENCOUNTER — Telehealth: Payer: Self-pay | Admitting: *Deleted

## 2024-03-22 DIAGNOSIS — D509 Iron deficiency anemia, unspecified: Secondary | ICD-10-CM

## 2024-03-22 DIAGNOSIS — E538 Deficiency of other specified B group vitamins: Secondary | ICD-10-CM

## 2024-03-22 DIAGNOSIS — D500 Iron deficiency anemia secondary to blood loss (chronic): Secondary | ICD-10-CM

## 2024-03-22 LAB — CBC WITH DIFFERENTIAL/PLATELET
Abs Immature Granulocytes: 0.03 K/uL (ref 0.00–0.07)
Basophils Absolute: 0 K/uL (ref 0.0–0.1)
Basophils Relative: 1 %
Eosinophils Absolute: 0.2 K/uL (ref 0.0–0.5)
Eosinophils Relative: 2 %
HCT: 38.5 % (ref 36.0–46.0)
Hemoglobin: 12.2 g/dL (ref 12.0–15.0)
Immature Granulocytes: 0 %
Lymphocytes Relative: 24 %
Lymphs Abs: 2.1 K/uL (ref 0.7–4.0)
MCH: 32.4 pg (ref 26.0–34.0)
MCHC: 31.7 g/dL (ref 30.0–36.0)
MCV: 102.1 fL — ABNORMAL HIGH (ref 80.0–100.0)
Monocytes Absolute: 0.7 K/uL (ref 0.1–1.0)
Monocytes Relative: 8 %
Neutro Abs: 5.8 K/uL (ref 1.7–7.7)
Neutrophils Relative %: 65 %
Platelets: 320 K/uL (ref 150–400)
RBC: 3.77 MIL/uL — ABNORMAL LOW (ref 3.87–5.11)
RDW: 12.7 % (ref 11.5–15.5)
WBC: 8.8 K/uL (ref 4.0–10.5)
nRBC: 0 % (ref 0.0–0.2)

## 2024-03-22 LAB — IRON AND TIBC
Iron: 68 ug/dL (ref 28–170)
Saturation Ratios: 24 % (ref 10.4–31.8)
TIBC: 288 ug/dL (ref 250–450)
UIBC: 221 ug/dL

## 2024-03-22 LAB — FERRITIN: Ferritin: 219 ng/mL (ref 11–307)

## 2024-03-22 LAB — VITAMIN B12: Vitamin B-12: 820 pg/mL (ref 180–914)

## 2024-03-22 NOTE — Patient Instructions (Signed)
 Vanessa Villanueva - I am sorry I was unable to reach you today for our scheduled appointment. I work with Lavell Bari LABOR, FNP and am calling to support your healthcare needs. Please contact me at [phone] at your earliest convenience. I look forward to speaking with you soon.   Thank you,  Rosina Forte, BSN RN [facility], [facility] RN Care Manager Direct Dial: [phone]  Fax: [phone]

## 2024-03-23 ENCOUNTER — Telehealth: Payer: Self-pay

## 2024-03-23 NOTE — Telephone Encounter (Signed)
 Called patient to give lab results and directions from Randall Hope NP.  Let patient know labs were good and NP Burns recommended no IV Iron at this time.   Patient aware and understood

## 2024-03-24 LAB — METHYLMALONIC ACID, SERUM: Methylmalonic Acid, Quantitative: 110 nmol/L (ref 0–378)

## 2024-04-14 ENCOUNTER — Telehealth: Payer: Self-pay | Admitting: *Deleted

## 2024-04-20 ENCOUNTER — Ambulatory Visit: Admitting: Urology

## 2024-05-13 ENCOUNTER — Inpatient Hospital Stay: Attending: Oncology

## 2024-05-20 ENCOUNTER — Ambulatory Visit: Admitting: Oncology

## 2024-05-20 ENCOUNTER — Inpatient Hospital Stay: Admitting: Oncology

## 2024-05-24 ENCOUNTER — Inpatient Hospital Stay: Admitting: Oncology
